# Patient Record
Sex: Female | Born: 1985 | Race: White | Hispanic: No | Marital: Single | Smoking: Never smoker
Health system: Southern US, Community
[De-identification: ages and names within clinical notes are randomized; demographics above are authoritative.]

## PROBLEM LIST (undated history)

## (undated) DIAGNOSIS — E162 Hypoglycemia, unspecified: Secondary | ICD-10-CM

## (undated) DIAGNOSIS — G43909 Migraine, unspecified, not intractable, without status migrainosus: Secondary | ICD-10-CM

## (undated) DIAGNOSIS — R001 Bradycardia, unspecified: Secondary | ICD-10-CM

## (undated) HISTORY — DX: Bradycardia, unspecified: R00.1

## (undated) HISTORY — PX: WISDOM TOOTH EXTRACTION: SHX21

---

## 2003-04-17 ENCOUNTER — Emergency Department (HOSPITAL_COMMUNITY): Admission: EM | Admit: 2003-04-17 | Discharge: 2003-04-18 | Payer: Self-pay | Admitting: Emergency Medicine

## 2013-02-06 ENCOUNTER — Encounter: Payer: Self-pay | Admitting: Family Medicine

## 2013-02-06 ENCOUNTER — Ambulatory Visit (INDEPENDENT_AMBULATORY_CARE_PROVIDER_SITE_OTHER): Payer: BC Managed Care – PPO | Admitting: Family Medicine

## 2013-02-06 VITALS — BP 120/61 | HR 62 | Temp 98.2°F | Resp 16 | Ht 65.5 in | Wt 131.0 lb

## 2013-02-06 DIAGNOSIS — G43809 Other migraine, not intractable, without status migrainosus: Secondary | ICD-10-CM

## 2013-02-06 DIAGNOSIS — Z862 Personal history of diseases of the blood and blood-forming organs and certain disorders involving the immune mechanism: Secondary | ICD-10-CM

## 2013-02-06 DIAGNOSIS — Z3041 Encounter for surveillance of contraceptive pills: Secondary | ICD-10-CM | POA: Insufficient documentation

## 2013-02-06 DIAGNOSIS — Z8639 Personal history of other endocrine, nutritional and metabolic disease: Secondary | ICD-10-CM

## 2013-02-06 DIAGNOSIS — G43009 Migraine without aura, not intractable, without status migrainosus: Secondary | ICD-10-CM

## 2013-02-06 DIAGNOSIS — H532 Diplopia: Secondary | ICD-10-CM

## 2013-02-06 LAB — CBC WITH DIFFERENTIAL/PLATELET
Basophils Relative: 0 % (ref 0–1)
Eosinophils Absolute: 0.1 10*3/uL (ref 0.0–0.7)
Hemoglobin: 13.3 g/dL (ref 12.0–15.0)
MCH: 28.1 pg (ref 26.0–34.0)
MCHC: 33.8 g/dL (ref 30.0–36.0)
Monocytes Relative: 10 % (ref 3–12)
Neutrophils Relative %: 54 % (ref 43–77)

## 2013-02-06 LAB — TSH: TSH: 1.655 u[IU]/mL (ref 0.350–4.500)

## 2013-02-06 LAB — COMPREHENSIVE METABOLIC PANEL
AST: 16 U/L (ref 0–37)
Albumin: 4.3 g/dL (ref 3.5–5.2)
Alkaline Phosphatase: 44 U/L (ref 39–117)
BUN: 9 mg/dL (ref 6–23)
Potassium: 4.2 mEq/L (ref 3.5–5.3)
Sodium: 138 mEq/L (ref 135–145)
Total Protein: 6.9 g/dL (ref 6.0–8.3)

## 2013-02-06 NOTE — Patient Instructions (Addendum)
Migraine Headache A migraine headache is an intense, throbbing pain on one or both sides of your head. A migraine can last for 30 minutes to several hours. CAUSES  The exact cause of a migraine headache is not always known. However, a migraine may be caused when nerves in the brain become irritated and release chemicals that cause inflammation. This causes pain. SYMPTOMS  Pain on one or both sides of your head.  Pulsating or throbbing pain.  Severe pain that prevents daily activities.  Pain that is aggravated by any physical activity.  Nausea, vomiting, or both.  Dizziness.  Pain with exposure to bright lights, loud noises, or activity.  General sensitivity to bright lights, loud noises, or smells. Before you get a migraine, you may get warning signs that a migraine is coming (aura). An aura may include:  Seeing flashing lights.  Seeing bright spots, halos, or zig-zag lines.  Having tunnel vision or blurred vision.  Having feelings of numbness or tingling.  Having trouble talking.  Having muscle weakness. MIGRAINE TRIGGERS  Alcohol.  Smoking.  Stress.  Menstruation.  Aged cheeses.  Foods or drinks that contain nitrates, glutamate, aspartame, or tyramine.  Lack of sleep.  Chocolate.  Caffeine.  Hunger.  Physical exertion.  Fatigue.  Medicines used to treat chest pain (nitroglycerine), birth control pills, estrogen, and some blood pressure medicines. DIAGNOSIS  A migraine headache is often diagnosed based on:  Symptoms.  Physical examination.  A CT scan or MRI of your head. TREATMENT Medicines may be given for pain and nausea. Medicines can also be given to help prevent recurrent migraines.  HOME CARE INSTRUCTIONS  Only take over-the-counter or prescription medicines for pain or discomfort as directed by your caregiver. The use of long-term narcotics is not recommended.  Lie down in a dark, quiet room when you have a migraine.  Keep a  journal to find out what may trigger your migraine headaches. For example, write down:  What you eat and drink.  How much sleep you get.  Any change to your diet or medicines.  Limit alcohol consumption.  Quit smoking if you smoke.  Get 7 to 9 hours of sleep, or as recommended by your caregiver.  Limit stress.  Keep lights dim if bright lights bother you and make your migraines worse. SEEK IMMEDIATE MEDICAL CARE IF:   Your migraine becomes severe.  You have a fever.  You have a stiff neck.  You have vision loss.  You have muscular weakness or loss of muscle control.  You start losing your balance or have trouble walking.  You feel faint or pass out.  You have severe symptoms that are different from your first symptoms. MAKE SURE YOU:   Understand these instructions.  Will watch your condition.  Will get help right away if you are not doing well or get worse. Document Released: 12/13/2005 Document Revised: 03/06/2012 Document Reviewed: 12/03/2011 [facility] Patient Information 2013 [place], [place].      Bickerstaff's Syndrome (Basilar Migraine) CAUSES  When migraine affects the circulation in back of the brain or neck, it can cause basilar migraine or Bickerstaff's syndrome.  SYMPTOMS  It occurs most frequently in young women. Symptoms include:  Dizziness.  Double vision.  Loss of balance.  Confusion.  Slurred speech.  Fainting.  Disorientation.  During the severe (acute) headache, some people lose consciousness. Often these patients are mistakenly thought to be intoxicated, under the influence of drugs, or suffering from other conditions. A previous history of  migraine is helpful in making the diagnosis.  TREATMENT  Basilar migraines are treated medically the same as all other migraines. HOME CARE INSTRUCTIONS   If this is your first diagnosed migraine headache, you may simply choose to wait and watch. You can wait to see if you have another  headache before deciding on a further treatment.  You may consult your caregiver or do as suggested by the current treating caregiver.  Numerous medications can prevent these headaches if they are recurrent or should they become recurrent. Your caregiver can help you with a medication or treatment program that will be helpful to you.  If this has been a chronic (long-standing) condition, using continuous narcotics is not recommended. Using long-term narcotics can cause recurrent migraines. Narcotics are a temporary measure only. They are used for the infrequent migraine that fails to respond to all other measures. SEEK IMMEDIATE MEDICAL CARE IF:   You do not get relief from the medications given to you or you have a recurrence of pain.  You have an unexplained oral temperature above 102 F (38.9 C), or as your caregiver suggests.  You have a stiff neck.  You have loss of vision.  You have muscular weakness.  You have loss of muscular control.  You develop severe symptoms different from your first symptoms.  You start losing your balance or have trouble walking.  You feel faint or pass out. MAKE SURE YOU:   Understand these instructions.  Will watch your condition.  Will get help right away if you are not doing well or get worse. Document Released: 12/13/2005 Document Revised: 03/06/2012 Document Reviewed: 07/31/2008 [facility] Patient Information 2013 [place], [place].

## 2013-02-06 NOTE — Progress Notes (Signed)
S:  This 27 y.o. Cauc female was last seen at [facility] in Oct 2011. She presents w/ hx of "migraine" with 1st episode in 8th grade. She does not recall if she was evaluated by medical professional. She has infrequent "episodes" w/ most recent symptom being on 02/01/2013. She had diplopia lasting about 1 minute followed by slightly off-balance and weak. This passed and she was able to go out and play tennis. She typically takes Ibuprofen 200 mg 3 tabs; this OTC med is effective. Today she feels well. She also has a hx of hypoglycemia but tries to avoid prolonged periods between meals/snacks.   Pt sees GYN for exams and RX for OCPs. She has been on oral contraception for years and notes no side effects.  Fam Hx may be positive for HA disorder; pt thinks her mother has mild HAs (not sure if she has migraines).  She works at [employer] in [employer] and does not have any significant job-related stress. She sleeps well and stays active.  ROS: As per HPI. Negative for fever, diaphoresis, unexplained weight change, CP or tightness, palpitations, SOB, n/v w/ HA, vision loss, numbness, weakness or syncope.  O:  Filed Vitals:   02/06/13 1450  BP: 120/61  Pulse: 62  Temp: 98.2 F (36.8 C)  Resp: 16   GEN: In NAD; WN,WD. HEENT: [place]/AT; EOMI w/ clear conj/ sclerae. Fundi normal. EACs/TMs normal. Oroph clear and moist.  NECK: Supple w/o LAN or TMG. COR: RRR.  No edema. LUNGS: Normal resp rate and effort. BACK: No CVAT. SKIN: W&D; no rashes or pallor. NEURO: A&O x 3; CNs intact. DTRs 2+/=. Motor function 5/5 in all major muscle groups. Gait is normal. Coordination is normal.  A/P:  1. Atypical migraine  Consider evaluation by Neurologist Continue treating as needed w/ OTC NSAID   CBC with Differential   TSH   T3, Free   Sedimentation Rate   Vitamin D, 25-hydroxy  2. Diplopia  Sedimentation Rate      Ambulatory referral to Ophthalmology  3. History of hypoglycemia  Comprehensive metabolic panel

## 2013-02-07 ENCOUNTER — Encounter: Payer: Self-pay | Admitting: Family Medicine

## 2013-02-10 ENCOUNTER — Other Ambulatory Visit: Payer: Self-pay

## 2013-03-07 ENCOUNTER — Ambulatory Visit (INDEPENDENT_AMBULATORY_CARE_PROVIDER_SITE_OTHER): Payer: BC Managed Care – PPO | Admitting: Physician Assistant

## 2013-03-07 VITALS — BP 122/58 | HR 71 | Temp 98.0°F | Resp 18 | Ht 66.0 in | Wt 129.0 lb

## 2013-03-07 DIAGNOSIS — R05 Cough: Secondary | ICD-10-CM

## 2013-03-07 DIAGNOSIS — J40 Bronchitis, not specified as acute or chronic: Secondary | ICD-10-CM

## 2013-03-07 MED ORDER — HYDROCODONE-HOMATROPINE 5-1.5 MG/5ML PO SYRP: ORAL_SOLUTION | ORAL | Status: DC

## 2013-03-07 MED ORDER — AZITHROMYCIN 250 MG PO TABS: ORAL_TABLET | ORAL | Status: DC

## 2013-03-07 MED ORDER — IPRATROPIUM BROMIDE 0.06 % NA SOLN: 2.0000 | Freq: Three times a day (TID) | NASAL | Status: DC

## 2013-03-07 NOTE — Progress Notes (Signed)
Patient ID: Otisha Spickler MRN: [id number], DOB: 1986-08-07, 27 y.o. Date of Encounter: 03/07/2013, 10:30 AM  Primary Physician: No primary provider on file.  Chief Complaint:  Chief Complaint  Patient presents with  . Sinus Problem    SINCE THURSDAY  . Cough    DRY  . Nasal Congestion    HPI: 27 y.o. year old female presents with a six day history worsening of nasal congestion, post nasal drip, sore throat, and cough. Mild maxillary sinus pressure. Afebrile. No chills. Nasal congestion thick and green/yellow. Cough is not productive and worse in the morning when she wakes. No shortness of breath or wheezing. No otalgia. Normal hearing. Has tried OTC cold preps without success. No GI complaints. Appetite normal. Multiple sick contacts at work. No recent antibiotics or recent travels. No leg trauma, sedentary periods, h/o cancer, or tobacco use.   History reviewed. No pertinent past medical history.   Home Meds: Prior to Admission medications   Medication Sig Start Date End Date Taking? Authorizing Provider  desogestrel-ethinyl estradiol (APRI,EMOQUETTE,SOLIA) 0.15-30 MG-MCG tablet Take 1 tablet by mouth daily.   Yes Historical Provider, MD           Allergies: No Known Allergies  History   Social History  . Marital Status: Single    Spouse Name: N/A    Number of Children: N/A  . Years of Education: N/A   Occupational History  . Not on file.   Social History Main Topics  . Smoking status: Never Smoker   . Smokeless tobacco: Not on file  . Alcohol Use: Not on file  . Drug Use: Not on file  . Sexually Active: Not on file   Other Topics Concern  . Not on file   Social History Narrative  . No narrative on file     Review of Systems: Constitutional: positive for fatigue. negative for chills, fever, night sweats or weight changes HEENT: see above Cardiovascular: negative for chest pain or palpitations Respiratory: positive for cough. negative for hemoptysis,  wheezing, or shortness of breath Abdominal: negative for abdominal pain, nausea, vomiting or diarrhea Dermatological: negative for rash Neurologic: negative for headache   Physical Exam: Blood pressure 122/58, pulse 71, temperature 98 F (36.7 C), temperature source Oral, resp. rate 18, height 5\' 6"  (1.676 m), weight 129 lb (58.514 kg), last menstrual period 03/06/2013, SpO2 98.00%., Body mass index is 20.83 kg/(m^2). General: Well developed, well nourished, in no acute distress. Head: Normocephalic, atraumatic, eyes without discharge, sclera non-icteric, nares are congested. Bilateral auditory canals clear, TM's are without perforation, pearly grey with reflective cone of light bilaterally. No sinus TTP. Oral cavity moist, dentition normal. Posterior pharynx with post nasal drip. No peritonsillar abscess, erythema, or tonsillar exudate. Uvula midline. Neck: Supple. No thyromegaly. Full ROM. No lymphadenopathy. Lungs: Coarse breath sounds bilaterally without wheezes, rales, or rhonchi. Breathing is unlabored.  Heart: RRR with S1 S2. No murmurs, rubs, or gallops appreciated. Msk:  Strength and tone normal for age. Extremities: No clubbing or cyanosis. No edema. Neuro: Alert and oriented X 3. Moves all extremities spontaneously. CNII-XII grossly in tact. Psych:  Responds to questions appropriately with a normal affect.     ASSESSMENT AND PLAN:  27 y.o. year old female with bronchitis and cough -Azithromycin 250 MG #6 2 po first day then 1 po next 4 days no RF -Atrovent NS 0.06% 2 sprays each nare bid prn #1 no RF -Hycodan #4oz 1 tsp po q 4-6 hours prn cough no  RF SED -Mucinex -Tylenol/Motrin prn -Rest/fluids -RTC precautions -RTC 3-5 days if no improvement  Signed, Eula Listen, PA-C 03/07/2013 10:30 AM

## 2013-03-11 ENCOUNTER — Encounter: Payer: Self-pay | Admitting: Physician Assistant

## 2013-11-01 ENCOUNTER — Other Ambulatory Visit: Payer: Self-pay

## 2016-02-27 ENCOUNTER — Ambulatory Visit (INDEPENDENT_AMBULATORY_CARE_PROVIDER_SITE_OTHER): Payer: BC Managed Care – PPO | Admitting: Physician Assistant

## 2016-02-27 VITALS — BP 112/60 | HR 50 | Temp 98.6°F | Resp 16 | Ht 66.0 in | Wt 146.0 lb

## 2016-02-27 DIAGNOSIS — B9789 Other viral agents as the cause of diseases classified elsewhere: Principal | ICD-10-CM

## 2016-02-27 DIAGNOSIS — J069 Acute upper respiratory infection, unspecified: Secondary | ICD-10-CM | POA: Diagnosis not present

## 2016-02-27 MED ORDER — HYDROCOD POLST-CPM POLST ER 10-8 MG/5ML PO SUER: 5.0000 mL | Freq: Two times a day (BID) | ORAL | Status: DC | PRN

## 2016-02-27 MED ORDER — IPRATROPIUM BROMIDE 0.06 % NA SOLN: 2.0000 | Freq: Three times a day (TID) | NASAL | Status: DC

## 2016-02-27 NOTE — Progress Notes (Signed)
   Michele Lane  MRN: [id number] DOB: [birth year]  Subjective:  Pt presents to clinic with about 3 week h/o cold symptoms.  She had a week in between that she was almost back to normal but then this week she got sick again. She currently has cough and nasal congestion.  The nasal congestion is clear but she does have a productive cough at times over the last 24h.  She is having no SOB or wheezing.  She does not have energy levels back to normal.  Home treatment - mucinex yesterday and other cold preps, motrin Sick contacts - works at [employer] vaccine - not this year  Patient Active Problem List   Diagnosis Date Noted  . 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 History of hypoglycemia 02/06/2013  . Uses oral contraception 02/06/2013    No current outpatient prescriptions on file prior to visit.   No current facility-administered medications on file prior to visit.    No Known Allergies  Review of Systems  Constitutional: Positive for fever (subjective) and chills.  HENT: Positive for congestion, postnasal drip, rhinorrhea (clear to yellow and green) and sore throat (worse in the am and eveings - ok during the day). Negative for dental problem.   Respiratory: Positive for cough and wheezing.   Gastrointestinal: Negative for nausea, vomiting and diarrhea.  Neurological: Positive for headaches. Negative for dizziness.   Objective:  BP 112/60 mmHg  Pulse 50  Temp(Src) 98.6 F (37 C) (Oral)  Resp 16  Ht  (1.676 m)  Wt 146 lb (66.225 kg)  BMI 23.58 kg/m2  SpO2 98%  Physical Exam  Constitutional: She is oriented to person, place, and time and well-developed, well-nourished, and in no distress.  HENT:  Head: Normocephalic and atraumatic.  Right Ear: Hearing, tympanic membrane, external ear and ear canal normal.  Left Ear: Hearing, tympanic membrane, external ear and ear canal normal.  Nose: Nose normal.  Mouth/Throat: Uvula is midline, oropharynx is clear and moist and mucous  membranes are normal.  congested  Eyes: Conjunctivae are normal.  Neck: Normal range of motion.  Cardiovascular: Normal rate, regular rhythm and normal heart sounds.   No murmur heard. Pulmonary/Chest: Effort normal and breath sounds normal.  Neurological: She is alert and oriented to person, place, and time. Gait normal.  Skin: Skin is warm and dry.  Psychiatric: Mood, memory, affect and judgment normal.  Vitals reviewed.   Assessment and Plan :  Viral URI with cough - Plan: chlorpheniramine-HYDROcodone (TUSSIONEX PENNKINETIC ER) 10-8 MG/5ML SUER, ipratropium (ATROVENT) 0.06 % nasal spray, Care order/instruction  Symptomatic care d/w pt  Benny Lennert PA-C  [facility] 02/27/2016 11:28 AM

## 2016-02-27 NOTE — Patient Instructions (Signed)
Please push fluids.  Tylenol and Motrin for fever and body aches.    A humidifier can help especially when the air is dry -if you do not have a humidifier you can boil a pot of water on the stove in your home to help with the dry air.  

## 2017-07-07 ENCOUNTER — Ambulatory Visit: Payer: BC Managed Care – PPO | Admitting: Emergency Medicine

## 2017-07-09 ENCOUNTER — Ambulatory Visit (INDEPENDENT_AMBULATORY_CARE_PROVIDER_SITE_OTHER): Payer: BC Managed Care – PPO | Admitting: Emergency Medicine

## 2017-07-09 ENCOUNTER — Encounter: Payer: Self-pay | Admitting: Emergency Medicine

## 2017-07-09 VITALS — BP 109/71 | HR 74 | Temp 97.6°F | Resp 18 | Ht 65.0 in | Wt 140.0 lb

## 2017-07-09 DIAGNOSIS — R200 Anesthesia of skin: Secondary | ICD-10-CM

## 2017-07-09 DIAGNOSIS — R531 Weakness: Secondary | ICD-10-CM

## 2017-07-09 DIAGNOSIS — R519 Headache, unspecified: Secondary | ICD-10-CM | POA: Insufficient documentation

## 2017-07-09 DIAGNOSIS — R51 Headache: Secondary | ICD-10-CM

## 2017-07-09 DIAGNOSIS — R42 Dizziness and giddiness: Secondary | ICD-10-CM

## 2017-07-09 MED ORDER — BUTALBITAL-APAP-CAFF-COD 50-325-40-30 MG PO CAPS: 1.0000 | ORAL_CAPSULE | ORAL | 0 refills | Status: DC | PRN

## 2017-07-09 MED ORDER — DIAZEPAM 5 MG PO TABS: 5.0000 mg | ORAL_TABLET | Freq: Two times a day (BID) | ORAL | 0 refills | Status: DC

## 2017-07-09 MED ORDER — BUTALBITAL-APAP-CAFFEINE 50-325-40 MG PO TABS: 1.0000 | ORAL_TABLET | Freq: Four times a day (QID) | ORAL | 0 refills | Status: DC | PRN

## 2017-07-09 NOTE — Progress Notes (Signed)
Michele Lane 31 y.o.   Chief Complaint  Patient presents with  . Numbness    to the left side of face   . Fatigue    weakness,headache, Nausea x 1 week     HISTORY OF PRESENT ILLNESS: [name] complaining of facial numbness, general weakness with balance issues, headache, and nausea that started 8 days ago and led to 2 ED visits during which she had CT brain and chest along with blood work done; results were all WNL, prescribed Valium and Fioricet; feels 25-50 % better than day 1-2; physically active, in good shape; has h/o chronic recurrent headaches she describes as migraines but never formally diagnosed. At present time she denies any neurological deficits but still having balance issues. Found to have PVC's while in the ED; EKG was normal.  HPI   Prior to Admission medications   Medication Sig Start Date End Date Taking? Authorizing Provider  butalbital-acetaminophen-caffeine (FIORICET WITH CODEINE) 50-325-40-30 MG capsule Take 1 capsule by mouth every 4 (four) hours as needed for headache.   Yes [provider]  diazepam (VALIUM) 5 MG tablet Take 5 mg by mouth every 6 (six) hours as needed for anxiety.   Yes [provider]  levonorgestrel-ethinyl estradiol (SEASONALE,INTROVALE,JOLESSA) 0.15-0.03 MG tablet Take 1 tablet by mouth daily. 11/28/15  Yes [provider]    No Known Allergies  Patient Active Problem List   Diagnosis Date Noted  . 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 History of hypoglycemia 02/06/2013  . Uses oral contraception 02/06/2013    History reviewed. No pertinent past medical history.  History reviewed. No pertinent surgical history.  Social History   Social History  . Marital status: Single    Spouse name: N/A  . Number of children: N/A  . Years of education: N/A   Occupational History  . Not on file.   Social History Main Topics  . Smoking status: Never Smoker  . Smokeless tobacco: Never Used  .  Alcohol use Yes  . Drug use: No  . Sexual activity: Yes    Birth control/ protection: Pill   Other Topics Concern  . Not on file   Social History Narrative   [employer] - class scheduler    Family History  Problem Relation Age of Onset  . Diabetes Maternal Grandfather      Review of Systems  Constitutional: Positive for malaise/fatigue. Negative for chills and fever.  HENT: Negative.  Negative for congestion, ear pain, nosebleeds, sinus pain and sore throat.   Eyes: Positive for photophobia. Negative for blurred vision, double vision, discharge and redness.  Respiratory: Negative.  Negative for cough, hemoptysis and shortness of breath.   Cardiovascular: Negative.  Negative for chest pain, palpitations and leg swelling.  Gastrointestinal: Negative.  Negative for abdominal pain, diarrhea, nausea and vomiting.  Genitourinary: Negative.   Musculoskeletal: Negative.  Negative for back pain, myalgias and neck pain.  Skin: Negative.  Negative for rash.  Neurological: Positive for dizziness, tingling (facial and lips), sensory change (face and tongue), weakness and headaches. Negative for speech change, focal weakness, seizures and loss of consciousness.  Endo/Heme/Allergies: Negative.   All other systems reviewed and are negative.    Vitals:   07/09/17 1110  BP: 109/71  Pulse: 74  Resp: 18  Temp: 97.6 F (36.4 C)     Physical Exam  Constitutional: She is oriented to person, place, and time. She appears well-developed and well-nourished.  HENT:  Head: Normocephalic and atraumatic.  Right Ear: Tympanic membrane, external ear and ear canal normal.  Left Ear: Tympanic membrane, external ear and ear canal normal.  Nose: Nose normal.  Mouth/Throat: Oropharynx is clear and moist. No oropharyngeal exudate.  Eyes: Pupils are equal, round, and reactive to light. Conjunctivae and EOM are normal.  Neck: Normal range of motion. Neck supple. No JVD present. No thyromegaly present.    Cardiovascular: Normal rate, regular rhythm, normal heart sounds and intact distal pulses.   Pulmonary/Chest: Effort normal and breath sounds normal.  Abdominal: Soft. Bowel sounds are normal. There is no tenderness.  Musculoskeletal: Normal range of motion.  Lymphadenopathy:    She has no cervical adenopathy.  Neurological: She is alert and oriented to person, place, and time. She displays normal reflexes. No cranial nerve deficit or sensory deficit. She exhibits normal muscle tone. Coordination normal.  Straight line walking a little off balance. Struggled.  Skin: Skin is warm and dry. Capillary refill takes less than 2 seconds.  Psychiatric: She has a normal mood and affect. Her behavior is normal.  Vitals reviewed.    ASSESSMENT & PLAN: Michele Lane was seen today for numbness and fatigue.  Diagnoses and all orders for this visit:  Facial numbness -     CBC with Differential/Platelet -     Comprehensive metabolic panel -     Sedimentation Rate -     Lyme Ab/Western Blot Reflex -     ANA,IFA RA Diag Pnl w/rflx Tit/Patn -     MR Brain W Wo Contrast; Future -     Ambulatory referral to Neurology  Intractable headache, unspecified chronicity pattern, unspecified headache type Comments: suspected migraine Orders: -     CBC with Differential/Platelet -     Comprehensive metabolic panel -     Sedimentation Rate -     Lyme Ab/Western Blot Reflex -     ANA,IFA RA Diag Pnl w/rflx Tit/Patn -     MR Brain W Wo Contrast; Future -     Ambulatory referral to Neurology  Dizziness  General weakness  Other orders -     diazepam (VALIUM) 5 MG tablet; Take 1 tablet (5 mg total) by mouth 2 (two) times daily. As needed. -     Discontinue: butalbital-acetaminophen-caffeine (FIORICET WITH CODEINE) 50-325-40-30 MG capsule; Take 1 capsule by mouth every 4 (four) hours as needed for headache. -     butalbital-acetaminophen-caffeine (ESGIC) 50-325-40 MG tablet; Take 1 tablet by mouth every 6  (six) hours as needed for headache.     Patient Instructions       IF you received an x-ray today, you will receive an invoice from [facility]. Please contact [facility] at [phone] with questions or concerns regarding your invoice.   IF you received labwork today, you will receive an invoice from [facility]. Please contact [facility] at [phone] with questions or concerns regarding your invoice.   Our billing staff will not be able to assist you with questions regarding bills from these companies.  You will be contacted with the lab results as soon as they are available. The fastest way to get your results is to activate your My Chart account. Instructions are located on the last page of this paperwork. If you have not heard from [place] regarding the results in 2 weeks, please contact this office.      Migraine Headache A migraine headache is a very strong throbbing pain on one side or both sides of your head. Migraines can also cause other  symptoms. Talk with your doctor about what things may bring on (trigger) your migraine headaches. Follow these instructions at home: Medicines  Take over-the-counter and prescription medicines only as told by your doctor.  Do not drive or use heavy machinery while taking prescription pain medicine.  To prevent or treat constipation while you are taking prescription pain medicine, your doctor may recommend that you: ? Drink enough fluid to keep your pee (urine) clear or pale yellow. ? Take over-the-counter or prescription medicines. ? Eat foods that are high in fiber. These include fresh fruits and vegetables, whole grains, and beans. ? Limit foods that are high in fat and processed sugars. These include fried and sweet foods. Lifestyle  Avoid alcohol.  Do not use any products that contain nicotine or tobacco, such as cigarettes and e-cigarettes. If you need help quitting, ask your doctor.  Get at least 8 hours of  sleep every night.  Limit your stress. General instructions   Keep a journal to find out what may bring on your migraines. For example, write down: ? What you eat and drink. ? How much sleep you get. ? Any change in what you eat or drink. ? Any change in your medicines.  If you have a migraine: ? Avoid things that make your symptoms worse, such as bright lights. ? It may help to lie down in a dark, quiet room. ? Do not drive or use heavy machinery. ? Ask your doctor what activities are safe for you.  Keep all follow-up visits as told by your doctor. This is important. Contact a doctor if:  You get a migraine that is different or worse than your usual migraines. Get help right away if:  Your migraine gets very bad.  You have a fever.  You have a stiff neck.  You have trouble seeing.  Your muscles feel weak or like you cannot control them.  You start to lose your balance a lot.  You start to have trouble walking.  You pass out (faint). This information is not intended to replace advice given to you by your health care provider. Make sure you discuss any questions you have with your health care provider. Document Released: 09/21/2008 Document Revised: 07/02/2016 Document Reviewed: 05/31/2016 Elsevier Interactive Patient Education  2017 [employer]      Edwina Barth, MD [facility]

## 2017-07-09 NOTE — Patient Instructions (Addendum)
     IF you received an x-ray today, you will receive an invoice from [facility]. Please contact [facility] at [phone] with questions or concerns regarding your invoice.   IF you received labwork today, you will receive an invoice from [facility]. Please contact [facility] at [phone] with questions or concerns regarding your invoice.   Our billing staff will not be able to assist you with questions regarding bills from these companies.  You will be contacted with the lab results as soon as they are available. The fastest way to get your results is to activate your My Chart account. Instructions are located on the last page of this paperwork. If you have not heard from us regarding the results in 2 weeks, please contact this office.     Migraine Headache A migraine headache is a very strong throbbing pain on one side or both sides of your head. Migraines can also cause other symptoms. Talk with your doctor about what things may bring on (trigger) your migraine headaches. Follow these instructions at home: Medicines  Take over-the-counter and prescription medicines only as told by your doctor.  Do not drive or use heavy machinery while taking prescription pain medicine.  To prevent or treat constipation while you are taking prescription pain medicine, your doctor may recommend that you: ? Drink enough fluid to keep your pee (urine) clear or pale yellow. ? Take over-the-counter or prescription medicines. ? Eat foods that are high in fiber. These include fresh fruits and vegetables, whole grains, and beans. ? Limit foods that are high in fat and processed sugars. These include fried and sweet foods. Lifestyle  Avoid alcohol.  Do not use any products that contain nicotine or tobacco, such as cigarettes and e-cigarettes. If you need help quitting, ask your doctor.  Get at least 8 hours of sleep every night.  Limit your stress. General instructions   Keep a  journal to find out what may bring on your migraines. For example, write down: ? What you eat and drink. ? How much sleep you get. ? Any change in what you eat or drink. ? Any change in your medicines.  If you have a migraine: ? Avoid things that make your symptoms worse, such as bright lights. ? It may help to lie down in a dark, quiet room. ? Do not drive or use heavy machinery. ? Ask your doctor what activities are safe for you.  Keep all follow-up visits as told by your doctor. This is important. Contact a doctor if:  You get a migraine that is different or worse than your usual migraines. Get help right away if:  Your migraine gets very bad.  You have a fever.  You have a stiff neck.  You have trouble seeing.  Your muscles feel weak or like you cannot control them.  You start to lose your balance a lot.  You start to have trouble walking.  You pass out (faint). This information is not intended to replace advice given to you by your health care provider. Make sure you discuss any questions you have with your health care provider. Document Released: 09/21/2008 Document Revised: 07/02/2016 Document Reviewed: 05/31/2016 Elsevier Interactive Patient Education  2017 Elsevier Inc.  

## 2017-07-14 ENCOUNTER — Encounter: Payer: Self-pay | Admitting: Neurology

## 2017-07-14 ENCOUNTER — Telehealth: Payer: Self-pay | Admitting: Emergency Medicine

## 2017-07-14 NOTE — Telephone Encounter (Signed)
Please advise 

## 2017-07-14 NOTE — Telephone Encounter (Signed)
Michele Lane is a good idea and she can pick one up at medical store or [facility], [facility], etc.

## 2017-07-14 NOTE — Telephone Encounter (Signed)
Pt's mother has been in contact with me concerning pt referral. We have not been able to get pt scheduled very soon even with urgent referral. I was able to get pt scheduled for [facility] on 07/28/17 at 1:45pm. I have also sent the referral to [facility] to see if they can schedule any sooner or around the same time for pt travel convenience. For now, they are keeping the [facility] appt and will see if [facility] offers anything sooner. The pt also wanted to ask if she could have a cane to help her walking. She didn't know if this was something we could order or if she should just pick one up from a medical store. Please let pt know what to do about the cane. Pt's mother can be reached at [phone].

## 2017-07-15 ENCOUNTER — Other Ambulatory Visit: Payer: Self-pay | Admitting: Emergency Medicine

## 2017-07-15 ENCOUNTER — Ambulatory Visit (HOSPITAL_COMMUNITY)
Admission: RE | Admit: 2017-07-15 | Discharge: 2017-07-15 | Disposition: A | Discharge status: Home / Self Care | Payer: BC Managed Care – PPO | Source: Ambulatory Visit | Attending: Emergency Medicine | Admitting: Emergency Medicine

## 2017-07-15 ENCOUNTER — Telehealth: Payer: Self-pay | Admitting: Family Medicine

## 2017-07-15 ENCOUNTER — Encounter (HOSPITAL_COMMUNITY): Payer: Self-pay | Admitting: Emergency Medicine

## 2017-07-15 ENCOUNTER — Inpatient Hospital Stay (HOSPITAL_COMMUNITY)
Admission: EM | Admit: 2017-07-15 | Discharge: 2017-07-19 | DRG: 060 | Disposition: A | Discharge status: Home / Self Care | Payer: BC Managed Care – PPO | Attending: Internal Medicine | Admitting: Internal Medicine

## 2017-07-15 ENCOUNTER — Ambulatory Visit (INDEPENDENT_AMBULATORY_CARE_PROVIDER_SITE_OTHER): Payer: BC Managed Care – PPO | Admitting: Emergency Medicine

## 2017-07-15 ENCOUNTER — Encounter: Payer: Self-pay | Admitting: Emergency Medicine

## 2017-07-15 VITALS — BP 107/67 | HR 41 | Temp 97.4°F | Resp 16 | Ht 65.5 in | Wt 138.2 lb

## 2017-07-15 DIAGNOSIS — G43001 Migraine without aura, not intractable, with status migrainosus: Secondary | ICD-10-CM | POA: Diagnosis present

## 2017-07-15 DIAGNOSIS — R51 Headache: Secondary | ICD-10-CM

## 2017-07-15 DIAGNOSIS — R42 Dizziness and giddiness: Secondary | ICD-10-CM | POA: Diagnosis present

## 2017-07-15 DIAGNOSIS — I4581 Long QT syndrome: Secondary | ICD-10-CM | POA: Diagnosis present

## 2017-07-15 DIAGNOSIS — R001 Bradycardia, unspecified: Secondary | ICD-10-CM

## 2017-07-15 DIAGNOSIS — R200 Anesthesia of skin: Secondary | ICD-10-CM | POA: Diagnosis present

## 2017-07-15 DIAGNOSIS — R112 Nausea with vomiting, unspecified: Secondary | ICD-10-CM | POA: Diagnosis not present

## 2017-07-15 DIAGNOSIS — G43009 Migraine without aura, not intractable, without status migrainosus: Secondary | ICD-10-CM | POA: Diagnosis present

## 2017-07-15 DIAGNOSIS — R519 Headache, unspecified: Secondary | ICD-10-CM

## 2017-07-15 DIAGNOSIS — G35 Multiple sclerosis: Secondary | ICD-10-CM | POA: Diagnosis not present

## 2017-07-15 DIAGNOSIS — G35D Multiple sclerosis, unspecified: Secondary | ICD-10-CM | POA: Diagnosis present

## 2017-07-15 DIAGNOSIS — Z833 Family history of diabetes mellitus: Secondary | ICD-10-CM

## 2017-07-15 DIAGNOSIS — Z8051 Family history of malignant neoplasm of kidney: Secondary | ICD-10-CM

## 2017-07-15 HISTORY — DX: Migraine, unspecified, not intractable, without status migrainosus: G43.909

## 2017-07-15 HISTORY — DX: Hypoglycemia, unspecified: E16.2

## 2017-07-15 LAB — COMPREHENSIVE METABOLIC PANEL
ALBUMIN: 4.4 g/dL (ref 3.5–5.5)
ALBUMIN: 4.2 g/dL (ref 3.5–5.0)
ALK PHOS: 53 U/L (ref 38–126)
ALT: 11 IU/L (ref 0–32)
ALT: 25 U/L (ref 14–54)
ANION GAP: 7 (ref 5–15)
AST: 25 IU/L (ref 0–40)
AST: 25 U/L (ref 15–41)
Albumin/Globulin Ratio: 1.7 (ref 1.2–2.2)
Alkaline Phosphatase: 54 IU/L (ref 39–117)
BILIRUBIN TOTAL: 0.4 mg/dL (ref 0.3–1.2)
BUN / CREAT RATIO: 13 (ref 9–23)
BUN: 10 mg/dL (ref 6–20)
BUN: 9 mg/dL (ref 6–20)
Bilirubin Total: 0.3 mg/dL (ref 0.0–1.2)
CALCIUM: 9.7 mg/dL (ref 8.9–10.3)
CO2: 16 mmol/L — AB (ref 20–29)
CO2: 26 mmol/L (ref 22–32)
CREATININE: 0.77 mg/dL (ref 0.57–1.00)
CREATININE: 0.84 mg/dL (ref 0.44–1.00)
Calcium: 9.7 mg/dL (ref 8.7–10.2)
Chloride: 109 mmol/L — ABNORMAL HIGH (ref 96–106)
Chloride: 104 mmol/L (ref 101–111)
GFR calc Af Amer: 117 mL/min/{1.73_m2} (ref >59)
GFR calc Af Amer: >60 mL/min (ref >60)
GFR calc non Af Amer: 102 mL/min/{1.73_m2} (ref >59)
GFR calc non Af Amer: >60 mL/min (ref >60)
GLUCOSE: 106 mg/dL — AB (ref 65–99)
GLUCOSE: 99 mg/dL (ref 65–99)
Globulin, Total: 2.6 g/dL (ref 1.5–4.5)
Potassium: 4.3 mmol/L (ref 3.5–5.2)
Potassium: 4.2 mmol/L (ref 3.5–5.1)
Sodium: 147 mmol/L — ABNORMAL HIGH (ref 134–144)
Sodium: 137 mmol/L (ref 135–145)
TOTAL PROTEIN: 7 g/dL (ref 6.0–8.5)
TOTAL PROTEIN: 7.2 g/dL (ref 6.5–8.1)

## 2017-07-15 LAB — CBC WITH DIFFERENTIAL/PLATELET
BASOS ABS: 0 10*3/uL (ref 0.0–0.2)
BASOS PCT: 0 %
Basophils Absolute: 0 10*3/uL (ref 0.0–0.1)
Basos: 0 %
EOS (ABSOLUTE): 0 10*3/uL (ref 0.0–0.4)
Eos: 1 %
Eosinophils Absolute: 0.1 10*3/uL (ref 0.0–0.7)
Eosinophils Relative: 1 %
HEMATOCRIT: 39.9 % (ref 36.0–46.0)
HEMOGLOBIN: 12.8 g/dL (ref 11.1–15.9)
HEMOGLOBIN: 13.1 g/dL (ref 12.0–15.0)
Hematocrit: 40.2 % (ref 34.0–46.6)
IMMATURE GRANS (ABS): 0 10*3/uL (ref 0.0–0.1)
Immature Granulocytes: 0 %
LYMPHS ABS: 3 10*3/uL (ref 0.7–4.0)
LYMPHS: 23 %
Lymphocytes Absolute: 1.5 10*3/uL (ref 0.7–3.1)
Lymphocytes Relative: 35 %
MCH: 25.9 pg — ABNORMAL LOW (ref 26.6–33.0)
MCH: 27.5 pg (ref 26.0–34.0)
MCHC: 31.8 g/dL (ref 31.5–35.7)
MCHC: 32.8 g/dL (ref 30.0–36.0)
MCV: 81 fL (ref 79–97)
MCV: 83.8 fL (ref 78.0–100.0)
MONOCYTES: 7 %
MONOS PCT: 7 %
Monocytes Absolute: 0.5 10*3/uL (ref 0.1–0.9)
Monocytes Absolute: 0.6 10*3/uL (ref 0.1–1.0)
NEUTROS ABS: 4.8 10*3/uL (ref 1.7–7.7)
NEUTROS PCT: 57 %
Neutrophils Absolute: 4.5 10*3/uL (ref 1.4–7.0)
Neutrophils: 69 %
PLATELETS: 271 10*3/uL (ref 150–379)
Platelets: 296 10*3/uL (ref 150–400)
RBC: 4.94 x10E6/uL (ref 3.77–5.28)
RBC: 4.76 MIL/uL (ref 3.87–5.11)
RDW: 14.3 % (ref 12.3–15.4)
RDW: 13.3 % (ref 11.5–15.5)
WBC: 6.5 10*3/uL (ref 3.4–10.8)
WBC: 8.5 10*3/uL (ref 4.0–10.5)

## 2017-07-15 LAB — ANA,IFA RA DIAG PNL W/RFLX TIT/PATN
ANA TITER 1: NEGATIVE
Cyclic Citrullin Peptide Ab: 1 units (ref 0–19)
Rhuematoid fact SerPl-aCnc: <10 IU/mL (ref 0.0–13.9)

## 2017-07-15 LAB — URINALYSIS, ROUTINE W REFLEX MICROSCOPIC
BACTERIA UA: NONE SEEN
Bilirubin Urine: NEGATIVE
GLUCOSE, UA: NEGATIVE mg/dL
Ketones, ur: NEGATIVE mg/dL
LEUKOCYTES UA: NEGATIVE
Nitrite: NEGATIVE
PH: 6 (ref 5.0–8.0)
Protein, ur: NEGATIVE mg/dL
SPECIFIC GRAVITY, URINE: 1.002 — AB (ref 1.005–1.030)
SQUAMOUS EPITHELIAL / LPF: NONE SEEN
WBC, UA: NONE SEEN WBC/hpf (ref 0–5)

## 2017-07-15 LAB — I-STAT BETA HCG BLOOD, ED (MC, WL, AP ONLY): I-stat hCG, quantitative: <5 m[IU]/mL (ref <5)

## 2017-07-15 LAB — SEDIMENTATION RATE: SED RATE: 6 mm/h (ref 0–32)

## 2017-07-15 LAB — LYME AB/WESTERN BLOT REFLEX: Lyme IgG/IgM Ab: <0.91 {ISR} (ref 0.00–0.90)

## 2017-07-15 MED ORDER — ONDANSETRON HCL 4 MG PO TABS: 4.0000 mg | ORAL_TABLET | Freq: Three times a day (TID) | ORAL | 0 refills | Status: DC | PRN

## 2017-07-15 MED ORDER — DIAZEPAM 5 MG PO TABS: 5.0000 mg | ORAL_TABLET | Freq: Two times a day (BID) | ORAL | 0 refills | Status: DC

## 2017-07-15 MED ORDER — GADOBENATE DIMEGLUMINE 529 MG/ML IV SOLN
15.0000 mL | Freq: Once | INTRAVENOUS | Status: AC | PRN
  Administered 2017-07-15: 15 mL via INTRAVENOUS

## 2017-07-15 MED ORDER — ONDANSETRON HCL 4 MG/2ML IJ SOLN
2.0000 mg | Freq: Once | INTRAMUSCULAR | Status: AC
  Administered 2017-07-15: 2 mg via INTRAMUSCULAR

## 2017-07-15 MED ORDER — SODIUM CHLORIDE 0.9 % IV SOLN
1000.0000 mg | Freq: Once | INTRAVENOUS | Status: AC
  Administered 2017-07-16: 1000 mg via INTRAVENOUS
  Filled 2017-07-15: qty 8

## 2017-07-15 MED ORDER — BUTALBITAL-APAP-CAFFEINE 50-325-40 MG PO TABS: 1.0000 | ORAL_TABLET | Freq: Four times a day (QID) | ORAL | 0 refills | Status: DC | PRN

## 2017-07-15 NOTE — Telephone Encounter (Signed)
Received call report from Dr. Margo Aye of radiology regarding MRI brain revealed multiple enhancing lesions consistent with active demyelination.  Spoke with patient's mother (per patient's request) regarding results.  Spoke with neurology on call at [facility] who recommended admission for iv solumedrol, lumbar puncture, and further confirmation of multiple sclerosis.  Advised patient and mother of recommendations for admission; pt desires admission/presentation at [facility]; mother to transport pt there now.  To Dr. Verita Lamb.

## 2017-07-15 NOTE — Consult Note (Signed)
NEURO HOSPITALIST CONSULT NOTE   Requestig physician: Dr. Bebe Shaggy  Reason for Consult: MRI findings most consistent with MS  History obtained from:  Patient and Chart     HPI:                                                                                                                                          Michele Lane is an 31 y.o. female who presents with a 2 week history of progressive left facial sensory numbness, headaches, dizziness and gait unsteadiness. Symptoms began on June 6. An outpatient MRI of the brain was obtained on Friday, revealing multiple enhancing and non-enhancing lesions exhibiting a distribution, morphologies and signal characteristics most consistent with multiple sclerosis (images personally reviewed).   The official Radiology report conclusions for the MRI are as follows: 1. Fairly numerous T2 and FLAIR lesions in the cerebral white matter, and nearly 2 cm lesion of the left cerebellar peduncle. The constellation of findings is strongly suggestive of Multiple Sclerosis. 2. At least 8 lesions, including that in the left cerebellar peduncle, are enhancing indicative of active demyelination.  She has no prior history of neurological symptoms, including no episodes of optic neuritis. 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   Past Medical History:  Diagnosis Date  . Hypoglycemia    History reviewed. No pertinent surgical history.  Family History  Problem Relation Age of Onset  . Diabetes Maternal Grandfather    Social History:  reports that she has never smoked. She has never used smokeless tobacco. She reports that she drinks alcohol. She reports that she does not use drugs.  No Known Allergies  MEDICATIONS:                                                                                                                     Prior to Admission:  Prescriptions Prior to  Admission  Medication Sig Dispense Refill Last Dose  . butalbital-acetaminophen-caffeine (ESGIC) 50-325-40 MG tablet Take 1 tablet by mouth every 6 (six) hours as needed for headache. 30 tablet 0 07/15/2017 at Unknown time  . diazepam (VALIUM) 5 MG tablet Take 1 tablet (5 mg total) by mouth 2 (two) times daily. As needed. (  Patient taking differently: Take 5 mg by mouth every 12 (twelve) hours as needed for anxiety or muscle spasms. As needed.) 20 tablet 0 07/15/2017 at Unknown time  . ibuprofen (ADVIL,MOTRIN) 200 MG tablet Take 200-400 mg by mouth every 6 (six) hours as needed for moderate pain.   07/15/2017 at Unknown time  . levonorgestrel-ethinyl estradiol (SEASONALE,INTROVALE,JOLESSA) 0.15-0.03 MG tablet Take 1 tablet by mouth daily.  0 07/13/2017  . ondansetron (ZOFRAN) 4 MG tablet Take 1 tablet (4 mg total) by mouth every 8 (eight) hours as needed for nausea or vomiting. 20 tablet 0 unk    ROS:                                                                                                                                       No chest pain, abdominal pain, limb pain, limb weakness, limb sensory numbness, vision changes or confusion. No symptoms of infection. Other ROS as per HPI.   Blood pressure 119/76, pulse 82, temperature 98.5 F (36.9 C), temperature source Oral, resp. rate 17, height 5\' 5"  (1.651 m), weight 62.6 kg (138 lb), last menstrual period 07/14/2017, SpO2 100 %.  General Examination:                                                                                                      HEENT-  [place]/AT   Lungs- Respirations unlabored Extremities- No edema  Neurological Examination Mental Status: Alert, fully oriented, thought content appropriate.  Speech fluent without evidence of aphasia.  Able to follow all commands without difficulty. Cranial Nerves: II: Visual fields intact bilaterally. PERRL  III,IV, VI: Ptosis not present. EOMI without nystagmus.   V,VII: Smile symmetric.  Facial temp sensation normal bilaterally VIII: Hearing intact to voice IX,X: No hypophonia or hoarseness XI: Symmetric XII: Midline tongue extension Motor: Right : Upper extremity   5/5    Left:     Upper extremity   5/5  Lower extremity   5/5     Lower extremity   5/5 Normal tone throughout; no atrophy noted Sensory: Temp and light touch intact in all 4 extremities without extinction Deep Tendon Reflexes: 2+ and symmetric throughout Plantars: Right: downgoing  Left: downgoing Cerebellar: No ataxia with FNF bilaterally Gait: Deferred  Lab Results: Basic Metabolic Panel:  Recent Labs Lab 07/09/17 1150 07/15/17 2028  NA 147* 137  K 4.3 4.2  CL 109* 104  CO2 16* 26  GLUCOSE 106* 99  BUN 10 9  CREATININE 0.77  0.84  CALCIUM 9.7 9.7    Liver Function Tests:  Recent Labs Lab 07/09/17 1150 07/15/17 2028  AST 25 25  ALT 11 25  ALKPHOS 54 53  BILITOT 0.3 0.4  PROT 7.0 7.2  ALBUMIN 4.4 4.2   No results for input(s): LIPASE, AMYLASE in the last 168 hours. No results for input(s): AMMONIA in the last 168 hours.  CBC:  Recent Labs Lab 07/09/17 1150 07/15/17 2028  WBC 6.5 8.5  NEUTROABS 4.5 4.8  HGB 12.8 13.1  HCT 40.2 39.9  MCV 81 83.8  PLT 271 296    Cardiac Enzymes: No results for input(s): CKTOTAL, CKMB, CKMBINDEX, TROPONINI in the last 168 hours.  Lipid Panel: No results for input(s): CHOL, TRIG, HDL, CHOLHDL, VLDL, LDLCALC in the last 168 hours.  CBG: No results for input(s): GLUCAP in the last 168 hours.  Microbiology: No results found for this or any previous visit.  Coagulation Studies: No results for input(s): LABPROT, INR in the last 72 hours.  Imaging: Mr [name] CM Contrast  Result Date: 07/15/2017 CLINICAL DATA:  31 year old female with intractable headache, left facial numbness. Possible migraine. EXAM: MRI HEAD WITHOUT AND WITH CONTRAST TECHNIQUE: Multiplanar, multiecho pulse sequences of the brain and surrounding structures were  obtained without and with intravenous contrast. CONTRAST:  71mL MULTIHANCE GADOBENATE DIMEGLUMINE 529 MG/ML IV SOLN COMPARISON:  None. FINDINGS: Brain: There are fairly numerous foci of abnormal cerebral white matter T2 and FLAIR hyperintensity. Multiple periventricular lesions are demonstrated, and several of these have a perpendicular orientation to the ventricles (Dawson fingers series 10, image 99). There is marginal involvement of the body of the left corpus callosum. There are scattered subcortical white matter lesions, including in both temporal lobes. The largest lesion is a nearly 2 cm T2 and FLAIR hyperintense area in the left cerebellar peduncle (series 6, image 10). Most of the lesions demonstrate increased trace diffusion signal, but only a right occipital periventricular white matter lesion appears restricted on diffusion (series 350, image 30). However, following contrast administration multiple of the lesions are seen to enhance. The left cerebellar peduncle lesion demonstrates an enhancing area of 13 mm. At least 7 other subcentimeter enhancing white matter lesions are scattered in both hemispheres (series 12 and series 13. Despite the size and enhancement of the above lesions, there is no associated mass effect, and no surrounding cerebral edema. No definite cortical involvement is identified. There is no involvement of the deep gray matter nuclei. The brainstem and cerebellum appear spared except for the left cerebellar peduncle. The cervicomedullary junction and visible cervical spine appear grossly normal. No vascular territory areas of restricted diffusion to suggest acute infarction. No midline shift, ventriculomegaly, extra-axial collection or acute intracranial hemorrhage. Cervicomedullary junction and pituitary are within normal limits. No chronic cerebral blood products or mineralization. Vascular: Major intracranial vascular flow voids are preserved. Skull and upper cervical spine:  Grossly negative visualized cervical spine. Visualized bone marrow signal is within normal limits. Sinuses/Orbits: No optic nerve or orbit signal abnormality is evident. Paranasal sinuses are clear. Other: Mastoid air cells are clear. Visible internal auditory structures appear normal. Negative scalp and face soft tissues. IMPRESSION: 1. Fairly numerous T2 and FLAIR lesions in the cerebral white matter, and nearly 2 cm lesion of the left cerebellar peduncle. The constellation of findings is is strongly suggestive of Multiple Sclerosis. 2. At least 8 lesions, including that in the left cerebellar peduncle, are enhancing indicative of active demyelination. 3. Preliminary report of this study  was discussed by telephone with Dr. Nilda Simmer, who is covering for Dr. Edwina Barth , at 1555 hours today. Electronically Signed   By: Odessa Fleming M.D.   On: 07/15/2017 16:24   Assessment: 30 year old female with multiple lesions on MRI appearing most consistent with MS. Several of the lesions enhance, consistent with acute exacerbation. No prior diagnosis of MS.  1. Other DDx includes neurosarcoidosis, CNS lupus, or atypical appearance for ADEM (there is no involvement of the deep grey nuclei). Epidemiologically and based upon imaging characteristics, MS is significantly more likely. Infection unlikely given no fever or constitutional symptoms, no recent tick bite or travel to endemic regions and no exposure to sick contacts.  2. Recently had Lyme titers drawn as an outpatient, which can be followed up by her PCP.   Recommendations: 1. MRI cervical spine with and without contrast to rule out cervical spinal cord lesions.  2. LP as an outpatient. Should follow up in 1-2 weeks with either [facility] or [facility].  3. Discussed benefits/risks of long-term disease modifying therapy with the patient and her mother. Final decision regarding such should be made at her outpatient Neurology clinic visit.  4.  Methylprednisolone 1000 mg IV qd x 5 days. Monitor CBC and Chem7 daily.  5. Start patient on a vitamin D supplement.   Electronically signed: Dr. Caryl Pina 07/15/2017, 11:48 PM

## 2017-07-15 NOTE — ED Triage Notes (Addendum)
Pt. reports left facial numbness onset 2 weeks ago with mild headache , nausea and emesis x2 today , pt. had an MRI of brain this afternoon due to these symptoms - advised by her PCP to go to ER for further evaluation .

## 2017-07-15 NOTE — ED Provider Notes (Signed)
[facility]-EMERGENCY DEPT Provider Note   CSN: [id number] Arrival date & time: 07/15/17  2014     History   Chief Complaint Chief Complaint  Patient presents with  . Numbness    HPI Michele Lane is a 31 y.o. female.  Patient presents to the ED with a chief complaint of facial numbness. She states that she was diagnosed with MS today and was told to come to the ER for an acute exacerbation.  She states that she has had worsening numbness of her left face and scalp with some minor blurred vision which has been gradually worsening over the course of the week.  She states that she originally thought that her symptoms were from a migraine, but after a couple of ED visits at out of town facilities she followed up with her PCP, who ordered an MRI, which she had done today.  The MRI showed evidence concerning for MS, and she was referred to the ER for treatment of what was thought to be an acute exacerbation.  She denies family history of MS.  She states that a couple of years ago she had diploplia, but this resolved and wasn't pursued further at that point.   The history is provided by the patient. No language interpreter was used.    Past Medical History:  Diagnosis Date  . Hypoglycemia     Patient Active Problem List   Diagnosis Date Noted  . Nausea and vomiting 07/15/2017  . Facial numbness 07/09/2017  . Intractable headache 07/09/2017  . Dizziness 07/09/2017  . General weakness 07/09/2017  . 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 History of hypoglycemia 02/06/2013  . Uses oral contraception 02/06/2013    History reviewed. No pertinent surgical history.  OB History    No data available       Home Medications    Prior to Admission medications   Medication Sig Start Date End Date Taking? Authorizing Provider  butalbital-acetaminophen-caffeine [facility]) 50-325-40 MG tablet Take 1 tablet by mouth every 6 (six) hours as needed for headache. 07/15/17   Georgina Quint, MD  diazepam  (VALIUM) 5 MG tablet Take 1 tablet (5 mg total) by mouth 2 (two) times daily. As needed. 07/15/17   Georgina Quint, MD  levonorgestrel-ethinyl estradiol (SEASONALE,INTROVALE,JOLESSA) 0.15-0.03 MG tablet Take 1 tablet by mouth daily. 11/28/15   [provider]  ondansetron (ZOFRAN) 4 MG tablet Take 1 tablet (4 mg total) by mouth every 8 (eight) hours as needed for nausea or vomiting. 07/15/17   Georgina Quint, MD    Family History Family History  Problem Relation Age of Onset  . Diabetes Maternal Grandfather     Social History Social History  Substance Use Topics  . Smoking status: Never Smoker  . Smokeless tobacco: Never Used  . Alcohol use Yes     Allergies   Patient has no known allergies.   Review of Systems Review of Systems  All other systems reviewed and are negative.    Physical Exam Updated Vital Signs BP 119/76   Pulse 82   Temp 98.5 F (36.9 C) (Oral)   Resp 17   Ht 5\' 5"  (1.651 m)   Wt 62.6 kg (138 lb)   LMP 07/14/2017   SpO2 100%   BMI 22.96 kg/m   Physical Exam  Constitutional: She is oriented to person, place, and time. She appears well-developed and well-nourished.  HENT:  Head: Normocephalic and atraumatic.  Right Ear: External ear normal.  Left Ear: External ear normal.  Eyes: Pupils are equal, round, and reactive to light. Conjunctivae and EOM are normal.  Neck: Normal range of motion. Neck supple.  No pain with neck flexion, no meningismus  Cardiovascular: Normal rate, regular rhythm and normal heart sounds.  Exam reveals no gallop and no friction rub.   No murmur heard. Pulmonary/Chest: Effort normal and breath sounds normal. No respiratory distress. She has no wheezes. She has no rales. She exhibits no tenderness.  Abdominal: Soft. She exhibits no distension and no mass. There is no tenderness. There is no rebound and no guarding.  Musculoskeletal: Normal range of motion. She exhibits no edema or tenderness.  Normal  gait.  Neurological: She is alert and oriented to person, place, and time. She has normal reflexes.  CN 3-12 intact, normal finger to nose, no pronator drift, sensation and strength intact bilaterally.  Skin: Skin is warm and dry.  Psychiatric: She has a normal mood and affect. Her behavior is normal. Judgment and thought content normal.  Nursing note and vitals reviewed.    ED Treatments / Results  Labs (all labs ordered are listed, but only abnormal results are displayed) Labs Reviewed  URINALYSIS, ROUTINE W REFLEX MICROSCOPIC - Abnormal; Notable for the following:       Result Value   Color, Urine COLORLESS (*)    Specific Gravity, Urine 1.002 (*)    Hgb urine dipstick SMALL (*)    All other components within normal limits  CBC WITH DIFFERENTIAL/PLATELET  COMPREHENSIVE METABOLIC PANEL  I-STAT BETA HCG BLOOD, ED ([facility], [facility], [facility] ONLY)    EKG  EKG Interpretation None       Radiology Mr [name] Wo Contrast  Result Date: 07/15/2017 CLINICAL DATA:  31 year old female with intractable headache, left facial numbness. Possible migraine. EXAM: MRI HEAD WITHOUT AND WITH CONTRAST TECHNIQUE: Multiplanar, multiecho pulse sequences of the brain and surrounding structures were obtained without and with intravenous contrast. CONTRAST:  79mL MULTIHANCE GADOBENATE DIMEGLUMINE 529 MG/ML IV SOLN COMPARISON:  None. FINDINGS: Brain: There are fairly numerous foci of abnormal cerebral white matter T2 and FLAIR hyperintensity. Multiple periventricular lesions are demonstrated, and several of these have a perpendicular orientation to the ventricles (Dawson fingers series 10, image 99). There is marginal involvement of the body of the left corpus callosum. There are scattered subcortical white matter lesions, including in both temporal lobes. The largest lesion is a nearly 2 cm T2 and FLAIR hyperintense area in the left cerebellar peduncle (series 6, image 10). Most of the lesions demonstrate increased trace  diffusion signal, but only a right occipital periventricular white matter lesion appears restricted on diffusion (series 350, image 30). However, following contrast administration multiple of the lesions are seen to enhance. The left cerebellar peduncle lesion demonstrates an enhancing area of 13 mm. At least 7 other subcentimeter enhancing white matter lesions are scattered in both hemispheres (series 12 and series 13. Despite the size and enhancement of the above lesions, there is no associated mass effect, and no surrounding cerebral edema. No definite cortical involvement is identified. There is no involvement of the deep gray matter nuclei. The brainstem and cerebellum appear spared except for the left cerebellar peduncle. The cervicomedullary junction and visible cervical spine appear grossly normal. No vascular territory areas of restricted diffusion to suggest acute infarction. No midline shift, ventriculomegaly, extra-axial collection or acute intracranial hemorrhage. Cervicomedullary junction and pituitary are within normal limits. No chronic cerebral blood products or mineralization. Vascular: Major intracranial vascular flow voids are preserved. Skull and  upper cervical spine: Grossly negative visualized cervical spine. Visualized bone marrow signal is within normal limits. Sinuses/Orbits: No optic nerve or orbit signal abnormality is evident. Paranasal sinuses are clear. Other: Mastoid air cells are clear. Visible internal auditory structures appear normal. Negative scalp and face soft tissues. IMPRESSION: 1. Fairly numerous T2 and FLAIR lesions in the cerebral white matter, and nearly 2 cm lesion of the left cerebellar peduncle. The constellation of findings is is strongly suggestive of Multiple Sclerosis. 2. At least 8 lesions, including that in the left cerebellar peduncle, are enhancing indicative of active demyelination. 3. Preliminary report of this study was discussed by telephone with Dr. Nilda Simmer, who is covering for Dr. Edwina Barth , at 1555 hours today. Electronically Signed   By: Odessa Fleming M.D.   On: 07/15/2017 16:24    Procedures Procedures (including critical care time)  Medications Ordered in ED Medications - No data to display   Initial Impression / Assessment and Plan / ED Course  I have reviewed the triage vital signs and the nursing notes.  Pertinent labs & imaging results that were available during my care of the patient were reviewed by me and considered in my medical decision making (see chart for details).     Patient with facial numbness x 2 weeks and MRI with finding concerning for MS.  Neurovascularly intact now.  Appreciate Dr. Otelia Limes for consult.  Recommends 1000 mg of solumedrol and Hospitalist admission.  Discussed patient with Dr. Otelia Limes, who agrees that this likely an acute exacerbation of new onset MS. Appreciate Dr. Criselda Peaches for admitting the patient to the hospital.  Final Clinical Impressions(s) / ED Diagnoses   Final diagnoses:  MS (multiple sclerosis) [facility])    New Prescriptions Current Discharge Medication List       Roxy Horseman, Cordelia Poche 07/16/17 0602    Zadie Rhine, MD 07/16/17 [phone]

## 2017-07-15 NOTE — Progress Notes (Signed)
Michele Lane 31 y.o.   Chief Complaint  Patient presents with  . Follow-up    07/09/17  . Emesis    started this morning    HISTORY OF PRESENT ILLNESS: [name] complaining of vomiting twice this am; seen by me 07/09/17; feels better but today she threw up twice. Scheduled for MRI today.  HPI   Prior to Admission medications   Medication Sig Start Date End Date Taking? Authorizing Michele Lane  butalbital-acetaminophen-caffeine Michele Lane) 50-325-40 MG tablet Take 1 tablet by mouth every 6 (six) hours as needed for headache. 07/15/17  Yes Michele Lane, Michele Kempf, MD  diazepam (VALIUM) 5 MG tablet Take 1 tablet (5 mg total) by mouth 2 (two) times daily. As needed. 07/15/17  Yes Michele Lane, Michele Kempf, MD  levonorgestrel-ethinyl estradiol (SEASONALE,INTROVALE,JOLESSA) 0.15-0.03 MG tablet Take 1 tablet by mouth daily. 11/28/15  Yes Michele Lane, Historical, MD    No Known Allergies  Patient Active Problem List   Diagnosis Date Noted  . Facial numbness 07/09/2017  . Intractable headache 07/09/2017  . Dizziness 07/09/2017  . General weakness 07/09/2017  . 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 History of hypoglycemia 02/06/2013  . Uses oral contraception 02/06/2013    No past medical history on file.  No past surgical history on file.  Social History   Social History  . Marital status: Single    Spouse name: N/A  . Number of children: N/A  . Years of education: N/A   Occupational History  . Not on file.   Social History Main Topics  . Smoking status: Never Smoker  . Smokeless tobacco: Never Used  . Alcohol use Yes  . Drug use: No  . Sexual activity: Yes    Birth control/ protection: Pill   Other Topics Concern  . Not on file   Social History Narrative   [employer] - class scheduler    Family History  Problem Relation Age of Onset  . Diabetes Maternal Grandfather      Review of Systems  Constitutional: Negative.  Negative for chills and fever.  HENT: Negative.     Eyes: Negative.   Respiratory: Negative.  Negative for cough and shortness of breath.   Cardiovascular: Negative.  Negative for chest pain and palpitations.  Gastrointestinal: Negative.  Negative for abdominal pain, nausea and vomiting.  Musculoskeletal: Negative.   Skin: Negative.  Negative for rash.  Neurological: Negative.  Negative for dizziness and headaches.  Endo/Heme/Allergies: Negative.   All other systems reviewed and are negative.  Vitals:   07/15/17 1148  BP: 107/67  Pulse: (!) 41  Resp: 16  Temp: (!) 97.4 F (36.3 C)     Physical Exam  Constitutional: She is oriented to person, place, and time. She appears well-developed and well-nourished.  HENT:  Head: Normocephalic and atraumatic.  Eyes: Pupils are equal, round, and reactive to light. Conjunctivae and EOM are normal.  Neck: Normal range of motion. Neck supple.  Cardiovascular: Normal rate, regular rhythm and normal heart sounds.   Pulmonary/Chest: Effort normal and breath sounds normal.  Abdominal: Soft. Bowel sounds are normal. She exhibits no distension. There is no tenderness.  Musculoskeletal: Normal range of motion.  Neurological: She is alert and oriented to person, place, and time. No sensory deficit. She exhibits normal muscle tone.  Skin: Skin is warm and dry. Capillary refill takes less than 2 seconds.  Psychiatric: She has a normal mood and affect. Her behavior is normal.  Vitals reviewed.    ASSESSMENT & PLAN:  Michele Lane was seen today for follow-up and emesis.  Diagnoses and all orders for this visit:  Nausea and vomiting, intractability of vomiting not specified, unspecified vomiting type -     ondansetron (ZOFRAN) injection 2 mg; Inject 1 mL (2 mg total) into the muscle once.  Intractable headache, unspecified chronicity pattern, unspecified headache type Comments: better  Other orders -     diazepam (VALIUM) 5 MG tablet; Take 1 tablet (5 mg total) by mouth 2 (two) times daily. As  needed. -     butalbital-acetaminophen-caffeine (ESGIC) 50-325-40 MG tablet; Take 1 tablet by mouth every 6 (six) hours as needed for headache. -     ondansetron (ZOFRAN) 4 MG tablet; Take 1 tablet (4 mg total) by mouth every 8 (eight) hours as needed for nausea or vomiting.    Patient Instructions       IF you received an x-ray today, you will receive an invoice from [facility]. Please contact [facility] at [phone] with questions or concerns regarding your invoice.   IF you received labwork today, you will receive an invoice from [place]. Please contact [facility] at [phone] with questions or concerns regarding your invoice.   Our billing staff will not be able to assist you with questions regarding bills from these companies.  You will be contacted with the lab results as soon as they are available. The fastest way to get your results is to activate your My Chart account. Instructions are located on the last page of this paperwork. If you have not heard from [place] regarding the results in 2 weeks, please contact this office.     Nausea and Vomiting, Adult Feeling sick to your stomach (nausea) means that your stomach is upset or you feel like you have to throw up (vomit). Feeling more and more sick to your stomach can lead to throwing up. Throwing up happens when food and liquid from your stomach are thrown up and out the mouth. Throwing up can make you feel weak and cause you to get dehydrated. Dehydration can make you tired and thirsty, make you have a dry mouth, and make it so you pee (urinate) less often. Older adults and people with other diseases or a weak defense system (immune system) are at higher risk for dehydration. If you feel sick to your stomach or if you throw up, it is important to follow instructions from your doctor about how to take care of yourself. Follow these instructions at home: Eating and drinking Follow these instructions as told by  your doctor:  Take an oral rehydration solution (ORS). This is a drink that is sold at pharmacies and stores.  Drink clear fluids in small amounts as you are able, such as: ? Water. ? Ice chips. ? Diluted fruit juice. ? Low-calorie sports drinks.  Eat bland, easy-to-digest foods in small amounts as you are able, such as: ? Bananas. ? Applesauce. ? Rice. ? Low-fat (lean) meats. ? Toast. ? Crackers.  Avoid fluids that have a lot of sugar or caffeine in them.  Avoid alcohol.  Avoid spicy or fatty foods.  General instructions  Drink enough fluid to keep your pee (urine) clear or pale yellow.  Wash your hands often. If you cannot use soap and water, use hand sanitizer.  Make sure that all people in your home wash their hands well and often.  Take over-the-counter and prescription medicines only as told by your doctor.  Rest at home while you get better.  Watch your  condition for any changes.  Breathe slowly and deeply when you feel sick to your stomach.  Keep all follow-up visits as told by your doctor. This is important. Contact a doctor if:  You have a fever.  You cannot keep fluids down.  Your symptoms get worse.  You have new symptoms.  You feel sick to your stomach for more than two days.  You feel light-headed or dizzy.  You have a headache.  You have muscle cramps. Get help right away if:  You have pain in your chest, neck, arm, or jaw.  You feel very weak or you pass out (faint).  You throw up again and again.  You see blood in your throw-up.  Your throw-up looks like black coffee grounds.  You have bloody or black poop (stools) or poop that look like tar.  You have a very bad headache, a stiff neck, or both.  You have a rash.  You have very bad pain, cramping, or bloating in your belly (abdomen).  You have trouble breathing.  You are breathing very quickly.  Your heart is beating very quickly.  Your skin feels cold and  clammy.  You feel confused.  You have pain when you pee.  You have signs of dehydration, such as: ? Dark pee, hardly any pee, or no pee. ? Cracked lips. ? Dry mouth. ? Sunken eyes. ? Sleepiness. ? Weakness. These symptoms may be an emergency. Do not wait to see if the symptoms will go away. Get medical help right away. Call your local emergency services (911 in the [place] Do not drive yourself to the hospital. This information is not intended to replace advice given to you by your health care Michele Lane. Make sure you discuss any questions you have with your health care Michele Lane. Document Released: 05/31/2008 Document Revised: 07/02/2016 Document Reviewed: 08/19/2015 Elsevier Interactive Patient Education  2018 Elsevier Inc.      Edwina Barth, MD [facility]

## 2017-07-15 NOTE — Patient Instructions (Addendum)
   IF you received an x-ray today, you will receive an invoice from [facility]. Please contact [facility] at [phone] with questions or concerns regarding your invoice.   IF you received labwork today, you will receive an invoice from [facility]. Please contact [facility] at [phone] with questions or concerns regarding your invoice.   Our billing staff will not be able to assist you with questions regarding bills from these companies.  You will be contacted with the lab results as soon as they are available. The fastest way to get your results is to activate your My Chart account. Instructions are located on the last page of this paperwork. If you have not heard from us regarding the results in 2 weeks, please contact this office.     Nausea and Vomiting, Adult Feeling sick to your stomach (nausea) means that your stomach is upset or you feel like you have to throw up (vomit). Feeling more and more sick to your stomach can lead to throwing up. Throwing up happens when food and liquid from your stomach are thrown up and out the mouth. Throwing up can make you feel weak and cause you to get dehydrated. Dehydration can make you tired and thirsty, make you have a dry mouth, and make it so you pee (urinate) less often. Older adults and people with other diseases or a weak defense system (immune system) are at higher risk for dehydration. If you feel sick to your stomach or if you throw up, it is important to follow instructions from your doctor about how to take care of yourself. Follow these instructions at home: Eating and drinking Follow these instructions as told by your doctor:  Take an oral rehydration solution (ORS). This is a drink that is sold at pharmacies and stores.  Drink clear fluids in small amounts as you are able, such as: ? Water. ? Ice chips. ? Diluted fruit juice. ? Low-calorie sports drinks.  Eat bland, easy-to-digest foods in small amounts as you  are able, such as: ? Bananas. ? Applesauce. ? Rice. ? Low-fat (lean) meats. ? Toast. ? Crackers.  Avoid fluids that have a lot of sugar or caffeine in them.  Avoid alcohol.  Avoid spicy or fatty foods.  General instructions  Drink enough fluid to keep your pee (urine) clear or pale yellow.  Wash your hands often. If you cannot use soap and water, use hand sanitizer.  Make sure that all people in your home wash their hands well and often.  Take over-the-counter and prescription medicines only as told by your doctor.  Rest at home while you get better.  Watch your condition for any changes.  Breathe slowly and deeply when you feel sick to your stomach.  Keep all follow-up visits as told by your doctor. This is important. Contact a doctor if:  You have a fever.  You cannot keep fluids down.  Your symptoms get worse.  You have new symptoms.  You feel sick to your stomach for more than two days.  You feel light-headed or dizzy.  You have a headache.  You have muscle cramps. Get help right away if:  You have pain in your chest, neck, arm, or jaw.  You feel very weak or you pass out (faint).  You throw up again and again.  You see blood in your throw-up.  Your throw-up looks like black coffee grounds.  You have bloody or black poop (stools) or poop that look like tar.  You   have a very bad headache, a stiff neck, or both.  You have a rash.  You have very bad pain, cramping, or bloating in your belly (abdomen).  You have trouble breathing.  You are breathing very quickly.  Your heart is beating very quickly.  Your skin feels cold and clammy.  You feel confused.  You have pain when you pee.  You have signs of dehydration, such as: ? Dark pee, hardly any pee, or no pee. ? Cracked lips. ? Dry mouth. ? Sunken eyes. ? Sleepiness. ? Weakness. These symptoms may be an emergency. Do not wait to see if the symptoms will go away. Get medical help  right away. Call your local emergency services (911 in the [place] Do not drive yourself to the hospital. This information is not intended to replace advice given to you by your health care provider. Make sure you discuss any questions you have with your health care provider. Document Released: 05/31/2008 Document Revised: 07/02/2016 Document Reviewed: 08/19/2015 Elsevier Interactive Patient Education  2018 Elsevier Inc.  

## 2017-07-15 NOTE — ED Notes (Signed)
Pt reports that she was diagnosed with MS this morning. Pt had an MRI this afternoon with neurology confirming diagnosis.

## 2017-07-16 ENCOUNTER — Inpatient Hospital Stay (HOSPITAL_COMMUNITY): Payer: BC Managed Care – PPO

## 2017-07-16 ENCOUNTER — Encounter (HOSPITAL_COMMUNITY): Payer: Self-pay | Admitting: Internal Medicine

## 2017-07-16 DIAGNOSIS — I340 Nonrheumatic mitral (valve) insufficiency: Secondary | ICD-10-CM | POA: Diagnosis not present

## 2017-07-16 DIAGNOSIS — R200 Anesthesia of skin: Secondary | ICD-10-CM | POA: Diagnosis present

## 2017-07-16 DIAGNOSIS — G43001 Migraine without aura, not intractable, with status migrainosus: Secondary | ICD-10-CM | POA: Diagnosis present

## 2017-07-16 DIAGNOSIS — G35 Multiple sclerosis: Secondary | ICD-10-CM | POA: Diagnosis present

## 2017-07-16 DIAGNOSIS — R001 Bradycardia, unspecified: Secondary | ICD-10-CM | POA: Diagnosis present

## 2017-07-16 DIAGNOSIS — Z833 Family history of diabetes mellitus: Secondary | ICD-10-CM | POA: Diagnosis not present

## 2017-07-16 DIAGNOSIS — R42 Dizziness and giddiness: Secondary | ICD-10-CM | POA: Diagnosis present

## 2017-07-16 DIAGNOSIS — I4581 Long QT syndrome: Secondary | ICD-10-CM | POA: Diagnosis present

## 2017-07-16 DIAGNOSIS — Z8051 Family history of malignant neoplasm of kidney: Secondary | ICD-10-CM | POA: Diagnosis not present

## 2017-07-16 LAB — CREATININE, SERUM
Creatinine, Ser: 0.8 mg/dL (ref 0.44–1.00)
GFR calc Af Amer: >60 mL/min (ref >60)
GFR calc non Af Amer: >60 mL/min (ref >60)

## 2017-07-16 LAB — CBC
HEMATOCRIT: 37 % (ref 36.0–46.0)
HEMOGLOBIN: 12.3 g/dL (ref 12.0–15.0)
MCH: 27.8 pg (ref 26.0–34.0)
MCHC: 33.2 g/dL (ref 30.0–36.0)
MCV: 83.5 fL (ref 78.0–100.0)
Platelets: 280 10*3/uL (ref 150–400)
RBC: 4.43 MIL/uL (ref 3.87–5.11)
RDW: 13.5 % (ref 11.5–15.5)
WBC: 8.6 10*3/uL (ref 4.0–10.5)

## 2017-07-16 LAB — TSH: TSH: 2.218 u[IU]/mL (ref 0.350–4.500)

## 2017-07-16 LAB — MRSA PCR SCREENING: MRSA by PCR: NEGATIVE

## 2017-07-16 LAB — GLUCOSE, CAPILLARY: GLUCOSE-CAPILLARY: 119 mg/dL — AB (ref 65–99)

## 2017-07-16 MED ORDER — TRAMADOL HCL 50 MG PO TABS
50.0000 mg | ORAL_TABLET | Freq: Four times a day (QID) | ORAL | Status: DC | PRN
  Administered 2017-07-16: 50 mg via ORAL
  Filled 2017-07-16: qty 1

## 2017-07-16 MED ORDER — IBUPROFEN 200 MG PO TABS: 200.0000 mg | ORAL_TABLET | Freq: Four times a day (QID) | ORAL | Status: DC | PRN

## 2017-07-16 MED ORDER — SODIUM CHLORIDE 0.9% FLUSH
3.0000 mL | Freq: Two times a day (BID) | INTRAVENOUS | Status: DC
  Administered 2017-07-16 – 2017-07-19 (×6): 3 mL via INTRAVENOUS

## 2017-07-16 MED ORDER — PANTOPRAZOLE SODIUM 40 MG PO TBEC
40.0000 mg | DELAYED_RELEASE_TABLET | Freq: Every day | ORAL | Status: DC
  Administered 2017-07-16 – 2017-07-19 (×4): 40 mg via ORAL
  Filled 2017-07-16 (×4): qty 1

## 2017-07-16 MED ORDER — ONDANSETRON HCL 4 MG PO TABS
4.0000 mg | ORAL_TABLET | Freq: Three times a day (TID) | ORAL | Status: DC | PRN
  Administered 2017-07-16 (×2): 4 mg via ORAL
  Filled 2017-07-16: qty 1

## 2017-07-16 MED ORDER — SODIUM CHLORIDE 0.9 % IV SOLN
1000.0000 mg | INTRAVENOUS | Status: DC
  Administered 2017-07-16 – 2017-07-18 (×2): 1000 mg via INTRAVENOUS
  Filled 2017-07-16 (×2): qty 8

## 2017-07-16 MED ORDER — PROCHLORPERAZINE EDISYLATE 5 MG/ML IJ SOLN
10.0000 mg | Freq: Four times a day (QID) | INTRAMUSCULAR | Status: DC
  Administered 2017-07-16 – 2017-07-17 (×6): 10 mg via INTRAVENOUS
  Filled 2017-07-16 (×6): qty 2

## 2017-07-16 MED ORDER — PNEUMOCOCCAL VAC POLYVALENT 25 MCG/0.5ML IJ INJ: 0.5000 mL | INJECTION | INTRAMUSCULAR | Status: DC | PRN

## 2017-07-16 MED ORDER — DIAZEPAM 5 MG PO TABS: 5.0000 mg | ORAL_TABLET | Freq: Two times a day (BID) | ORAL | Status: DC | PRN

## 2017-07-16 MED ORDER — IBUPROFEN 200 MG PO TABS
200.0000 mg | ORAL_TABLET | Freq: Four times a day (QID) | ORAL | Status: DC | PRN
  Filled 2017-07-16: qty 2

## 2017-07-16 MED ORDER — ENOXAPARIN SODIUM 40 MG/0.4ML ~~LOC~~ SOLN
40.0000 mg | SUBCUTANEOUS | Status: DC
  Administered 2017-07-17 – 2017-07-19 (×4): 40 mg via SUBCUTANEOUS
  Filled 2017-07-16 (×4): qty 0.4

## 2017-07-16 MED ORDER — SODIUM CHLORIDE 0.9 % IV SOLN
INTRAVENOUS | Status: DC
  Administered 2017-07-16: 02:00:00 via INTRAVENOUS

## 2017-07-16 MED ORDER — BUTALBITAL-APAP-CAFFEINE 50-325-40 MG PO TABS
1.0000 | ORAL_TABLET | Freq: Four times a day (QID) | ORAL | Status: DC | PRN
  Administered 2017-07-16 – 2017-07-19 (×2): 1 via ORAL
  Filled 2017-07-16 (×3): qty 1

## 2017-07-16 MED ORDER — IBUPROFEN 200 MG PO TABS
200.0000 mg | ORAL_TABLET | Freq: Four times a day (QID) | ORAL | Status: DC | PRN
  Administered 2017-07-16: 200 mg via ORAL

## 2017-07-16 MED ORDER — LEVONORGEST-ETH ESTRAD 91-DAY 0.15-0.03 MG PO TABS: 1.0000 | ORAL_TABLET | Freq: Every day | ORAL | Status: DC

## 2017-07-16 MED ORDER — KETOROLAC TROMETHAMINE 30 MG/ML IJ SOLN
30.0000 mg | Freq: Once | INTRAMUSCULAR | Status: AC
  Administered 2017-07-16: 30 mg via INTRAVENOUS
  Filled 2017-07-16: qty 1

## 2017-07-16 MED ORDER — DIAZEPAM 5 MG PO TABS
5.0000 mg | ORAL_TABLET | Freq: Three times a day (TID) | ORAL | Status: DC | PRN
  Administered 2017-07-16 – 2017-07-18 (×3): 5 mg via ORAL
  Filled 2017-07-16 (×3): qty 1

## 2017-07-16 MED ORDER — DIPHENHYDRAMINE HCL 50 MG/ML IJ SOLN
25.0000 mg | Freq: Once | INTRAMUSCULAR | Status: AC
  Administered 2017-07-16: 25 mg via INTRAVENOUS
  Filled 2017-07-16: qty 1

## 2017-07-16 NOTE — Progress Notes (Signed)
[facility] TEAM 1 - Stepdown/ICU TEAM  Michele Lane  PJA:[id number] DOB: Sep 02, 1986 DOA: 07/15/2017 PCP: Georgina Quint, MD    Brief Narrative:  31 y.o. female with a history of Hypoglycemic episodes and migraines who presented w/ facial numbness and dizziness.  2 weeks prior to this admit she developed lip numbness on her left bottom lip.  The next day, she was dizzy and off balance.  She subsequently developed headache w/ worsening facial numbness and dizziness.  She presented to the ED on Tuesday 7/10 in [place].  While in the ED, she was noted to have episodes of bradycardia.  She was given medication for headache and sent home.  By Thursday 7/12 she was worse and had developed nausea.  She returned to the ED.  There was concern for complex migraine and the provider advised her to come home to [facility], see her PCP, and possibly get an MRI.  She saw her PCP, and had an MRI 7/20 which showed a constellation of findings strongly suggestive of MS.  There was also concern for active demyelination.  She presented to the ED for acute treatment.    Subjective: Pt is seen for a f/u visit.    Assessment & Plan:  Newly diagnosed MS  Status migrainous   Sinus bradycardia   DVT prophylaxis: lovenox  Code Status: FULL CODE Family Communication:  Disposition Plan: stable for transfer to tele bed   Consultants:  Neurology   Procedures: none  Antimicrobials:  none   Objective: Blood pressure 119/70, pulse 71, temperature (!) 97.4 F (36.3 C), temperature source Oral, resp. rate 17, height 5\' 5"  (1.651 m), weight 62.8 kg (138 lb 7.2 oz), last menstrual period 07/14/2017, SpO2 97 %.  Intake/Output Summary (Last 24 hours) at 07/16/17 1059 Last data filed at 07/16/17 0400  Gross per 24 hour  Intake              423 ml  Output                0 ml  Net              423 ml   Filed Weights   07/15/17 2023 07/16/17 0200  Weight: 62.6 kg (138 lb) 62.8 kg (138 lb 7.2 oz)     Examination: Pt examined/admitted earlier today.    CBC:  Recent Labs Lab 07/09/17 1150 07/15/17 2028 07/16/17 0221  WBC 6.5 8.5 8.6  NEUTROABS 4.5 4.8  --   HGB 12.8 13.1 12.3  HCT 40.2 39.9 37.0  MCV 81 83.8 83.5  PLT 271 296 280   Basic Metabolic Panel:  Recent Labs Lab 07/09/17 1150 07/15/17 2028 07/16/17 0221  NA 147* 137  --   K 4.3 4.2  --   CL 109* 104  --   CO2 16* 26  --   GLUCOSE 106* 99  --   BUN 10 9  --   CREATININE 0.77 0.84 0.80  CALCIUM 9.7 9.7  --    GFR: Estimated Creatinine Clearance: 90 mL/min (by C-G formula based on SCr of 0.8 mg/dL).  Liver Function Tests:  Recent Labs Lab 07/09/17 1150 07/15/17 2028  AST 25 25  ALT 11 25  ALKPHOS 54 53  BILITOT 0.3 0.4  PROT 7.0 7.2  ALBUMIN 4.4 4.2   CBG:  Recent Labs Lab 07/16/17 0534  GLUCAP 119*    Recent Results (from the past 240 hour(s))  MRSA PCR Screening     Status:  None   Collection Time: 07/16/17  1:54 AM  Result Value Ref Range Status   MRSA by PCR NEGATIVE NEGATIVE Final    Comment:        The GeneXpert MRSA Assay (FDA approved for NASAL specimens only), is one component of a comprehensive MRSA colonization surveillance program. It is not intended to diagnose MRSA infection nor to guide or monitor treatment for MRSA infections.      Scheduled Meds: . diphenhydrAMINE  25 mg Intravenous Once  . enoxaparin (LOVENOX) injection  40 mg Subcutaneous Q24H  . ketorolac  30 mg Intravenous Once  . levonorgestrel-ethinyl estradiol  1 tablet Oral Daily  . prochlorperazine  10 mg Intravenous Q6H  . sodium chloride flush  3 mL Intravenous Q12H   Continuous Infusions: . sodium chloride 75 mL/hr at 07/16/17 0220     LOS: 0 days   Time spent: No Charge  Lonia Blood, MD [facility] Office  [phone] Pager - Text Page per Loretha Stapler as per below:  On-Call/Text Page:      [email or web address]      password [email or web address]  If 7PM-7AM, please contact  night-coverage [email or web address] Password [email or web address] 07/16/2017, 10:59 AM

## 2017-07-16 NOTE — Progress Notes (Signed)
Subjective: Awake, alert, complains of headache  Exam: Vitals:   07/16/17 0347 07/16/17 0818  BP: 108/65 119/70  Pulse: 71   Resp: 17   Temp: 98.1 F (36.7 C) (!) 97.4 F (36.3 C)   Gen: In bed, NAD Resp: non-labored breathing, no acute distress Abd: soft, nt  Neuro: MS: awake, alert, oriented EE:ATVVL, EOMI Motor: 5/5 strength Sensory:intact to LT Cerebellar: mild left sided ataxia  Pertinent Labs: CBC,BMP-unremarkable  Impression: 31 yo F with first clinical episode and imaging consistent with multiple sclerosis. Given the presence of both enhancing and non-enhancing lesions, and distribution in space, I feel that MS is by far the most likely diagnosis. With negative ESR, ANA I would favor treating as MS at this time.   Recommendations: 1) Solumedrol 1g x 5 days, today is day 2 2) MRI cervical and thoracic spien w/wo contrast.  3) compazine/benadryl/toradol for headache  Roland Rack, MD [facility] [phone]  If 7pm- 7am, please page neurology on call as listed in [place].

## 2017-07-16 NOTE — H&P (Signed)
History and Physical    Michele Lane WUJ:[id number] DOB: 1986-08-14 DOA: 07/15/2017  PCP: Georgina Quint, MD  Patient coming from: Home  Chief Complaint: Facial numbness  HPI: Michele Lane is a 31 y.o. female with medical history significant of Hypoglycemic episodes, migraines who presents for facial numbness and dizziness.  Michele Lane reports that about 2 weeks ago she initially developed lip numbness on her left bottom lip.  The next day, she was dizzy and off balance.  She subsequently developed headache, worsening facial numbness and dizziness.  She presented to the ED on Tuesday 7/10 in [place] (was with her mother) where she was worked up for heart attack and a stroke.  While in the ED, she was noted to have episodes of bradycardia, hence the cardiac work up.  She reports that everything was noted to be fine and she was given medication for headache and sent home.  By Thursday she was worse and had developed nausea.  She had gotten to the point where she was crawling because of the dizziness.  She presented again to the ED, there was concern for complex migraine and the provider there advised her to come home to [facility], see her PCP and possibly get an MRI.  She was again given migraine medication, Zofran and she was able to come back to [place].  She saw her PCP, and had an MRI today which showed a constellation of findings strongly suggestive of MS.  There was also concern for active demyelination.  She presented to the ED due to these findings for acute treatment.    Of note, while I was in the room she had multiple episodes of bradycardia down into the 20s and 30s on the monitor.  This was confirmed by palpation of the radial arteries.  She was asymptomatic through these ( though she was lying down ).  She continued to have a cogent conversation and did not develop dizziness, lightheadedness or chest pain.  These episodes lasted 30 sec to 1 min and then resolved to a normal HR in  the 60s-70s.  This happened twice while I was in the room.    ED Course: In the ED, her blood work was found to be completely normal. I reviewed her MRI.   Review of Systems: As per HPI otherwise 10 point review of systems negative.   Past Medical History:  Diagnosis Date  . Hypoglycemia   . Migraine     History reviewed. No pertinent surgical history.   She has never had surgery.    reports that she has never smoked. She has never used smokeless tobacco. She reports that she drinks about 0.6 oz of alcohol per week . She reports that she does not use drugs.  No Known Allergies  Reviewed with patient.  Family History  Problem Relation Age of Onset  . Neuropathy Mother   . Kidney cancer Father   . Diabetes Maternal Grandfather     Prior to Admission medications   Medication Sig Start Date End Date Taking? Authorizing Provider  butalbital-acetaminophen-caffeine [facility]) 50-325-40 MG tablet Take 1 tablet by mouth every 6 (six) hours as needed for headache. 07/15/17  Yes Sagardia, Eilleen Kempf, MD  diazepam (VALIUM) 5 MG tablet Take 1 tablet (5 mg total) by mouth 2 (two) times daily. As needed. Patient taking differently: Take 5 mg by mouth every 12 (twelve) hours as needed for anxiety or muscle spasms. As needed. 07/15/17  Yes Georgina Quint, MD  ibuprofen (  ADVIL,MOTRIN) 200 MG tablet Take 200-400 mg by mouth every 6 (six) hours as needed for moderate pain.   Yes [provider]  levonorgestrel-ethinyl estradiol (SEASONALE,INTROVALE,JOLESSA) 0.15-0.03 MG tablet Take 1 tablet by mouth daily. 11/28/15  Yes [provider]  ondansetron (ZOFRAN) 4 MG tablet Take 1 tablet (4 mg total) by mouth every 8 (eight) hours as needed for nausea or vomiting. 07/15/17  Yes Georgina Quint, MD    Physical Exam: Vitals:   07/15/17 2022 07/15/17 2023 07/15/17 2245 07/15/17 2330  BP: 112/64  119/76 118/67  Pulse: (!) 58  82 (!) 53  Resp: 16  17 17   Temp: 98.5 F (36.9 C)      TempSrc: Oral     SpO2: 98%  100% 100%  Weight:  138 lb (62.6 kg)    Height:  5\' 5"  (1.651 m)      Constitutional: NAD, calm, comfortable Vitals:   07/15/17 2022 07/15/17 2023 07/15/17 2245 07/15/17 2330  BP: 112/64  119/76 118/67  Pulse: (!) 58  82 (!) 53  Resp: 16  17 17   Temp: 98.5 F (36.9 C)     TempSrc: Oral     SpO2: 98%  100% 100%  Weight:  138 lb (62.6 kg)    Height:  5\' 5"  (1.651 m)     Eyes: PERRL, lids and conjunctivae normal ENMT: Mucous membranes are moist. Posterior pharynx clear of any exudate or lesions. Normal dentition.  Neck: normal, supple Respiratory: clear to auscultation bilaterally, no wheezing, no crackles. Normal respiratory effort.   Cardiovascular: She had episodes of bradycardia, alternating with a regular rhythm, no murmurs / rubs / gallops. No extremity edema. 2+ pedal pulses.  Abdomen: no tenderness, no masses palpated. Bowel sounds positive.  Musculoskeletal: no clubbing / cyanosis. No joint deformity upper and lower extremities.  Normal muscle tone.  Skin: no rashes, lesions, ulcers.  Neurologic: CN 2-12 grossly intact. Sensation decreased on left cheek and jaw. Strength 5/5 in all 4.  Finger to nose normal.  Psychiatric: Normal judgment and insight. Alert and oriented x 3. Normal mood.     Labs on Admission: I have personally reviewed following labs and imaging studies  CBC:  Recent Labs Lab 07/09/17 1150 07/15/17 2028  WBC 6.5 8.5  NEUTROABS 4.5 4.8  HGB 12.8 13.1  HCT 40.2 39.9  MCV 81 83.8  PLT 271 296   Basic Metabolic Panel:  Recent Labs Lab 07/09/17 1150 07/15/17 2028  NA 147* 137  K 4.3 4.2  CL 109* 104  CO2 16* 26  GLUCOSE 106* 99  BUN 10 9  CREATININE 0.77 0.84  CALCIUM 9.7 9.7   GFR: Estimated Creatinine Clearance: 85.7 mL/min (by C-G formula based on SCr of 0.84 mg/dL). Liver Function Tests:  Recent Labs Lab 07/09/17 1150 07/15/17 2028  AST 25 25  ALT 11 25  ALKPHOS 54 53  BILITOT 0.3 0.4  PROT  7.0 7.2  ALBUMIN 4.4 4.2   No results for input(s): LIPASE, AMYLASE in the last 168 hours. No results for input(s): AMMONIA in the last 168 hours. Coagulation Profile: No results for input(s): INR, PROTIME in the last 168 hours. Cardiac Enzymes: No results for input(s): CKTOTAL, CKMB, CKMBINDEX, TROPONINI in the last 168 hours. BNP (last 3 results) No results for input(s): PROBNP in the last 8760 hours. HbA1C: No results for input(s): HGBA1C in the last 72 hours. CBG: No results for input(s): GLUCAP in the last 168 hours. Lipid Profile: No results for  input(s): CHOL, HDL, LDLCALC, TRIG, CHOLHDL, LDLDIRECT in the last 72 hours. Thyroid Function Tests: No results for input(s): TSH, T4TOTAL, FREET4, T3FREE, THYROIDAB in the last 72 hours. Anemia Panel: No results for input(s): VITAMINB12, FOLATE, FERRITIN, TIBC, IRON, RETICCTPCT in the last 72 hours. Urine analysis:    Component Value Date/Time   COLORURINE COLORLESS (A) 07/15/2017 2024   APPEARANCEUR CLEAR 07/15/2017 2024   LABSPEC 1.002 (L) 07/15/2017 2024   PHURINE 6.0 07/15/2017 2024   GLUCOSEU NEGATIVE 07/15/2017 2024   HGBUR SMALL (A) 07/15/2017 2024   BILIRUBINUR NEGATIVE 07/15/2017 2024   KETONESUR NEGATIVE 07/15/2017 2024   PROTEINUR NEGATIVE 07/15/2017 2024   NITRITE NEGATIVE 07/15/2017 2024   LEUKOCYTESUR NEGATIVE 07/15/2017 2024    Radiological Exams on Admission: Mr [name] Wo Contrast  Result Date: 07/15/2017 CLINICAL DATA:  31 year old female with intractable headache, left facial numbness. Possible migraine. EXAM: MRI HEAD WITHOUT AND WITH CONTRAST TECHNIQUE: Multiplanar, multiecho pulse sequences of the brain and surrounding structures were obtained without and with intravenous contrast. CONTRAST:  15mL MULTIHANCE GADOBENATE DIMEGLUMINE 529 MG/ML IV SOLN COMPARISON:  None. FINDINGS: Brain: There are fairly numerous foci of abnormal cerebral white matter T2 and FLAIR hyperintensity. Multiple periventricular  lesions are demonstrated, and several of these have a perpendicular orientation to the ventricles (Dawson fingers series 10, image 99). There is marginal involvement of the body of the left corpus callosum. There are scattered subcortical white matter lesions, including in both temporal lobes. The largest lesion is a nearly 2 cm T2 and FLAIR hyperintense area in the left cerebellar peduncle (series 6, image 10). Most of the lesions demonstrate increased trace diffusion signal, but only a right occipital periventricular white matter lesion appears restricted on diffusion (series 350, image 30). However, following contrast administration multiple of the lesions are seen to enhance. The left cerebellar peduncle lesion demonstrates an enhancing area of 13 mm. At least 7 other subcentimeter enhancing white matter lesions are scattered in both hemispheres (series 12 and series 13. Despite the size and enhancement of the above lesions, there is no associated mass effect, and no surrounding cerebral edema. No definite cortical involvement is identified. There is no involvement of the deep gray matter nuclei. The brainstem and cerebellum appear spared except for the left cerebellar peduncle. The cervicomedullary junction and visible cervical spine appear grossly normal. No vascular territory areas of restricted diffusion to suggest acute infarction. No midline shift, ventriculomegaly, extra-axial collection or acute intracranial hemorrhage. Cervicomedullary junction and pituitary are within normal limits. No chronic cerebral blood products or mineralization. Vascular: Major intracranial vascular flow voids are preserved. Skull and upper cervical spine: Grossly negative visualized cervical spine. Visualized bone marrow signal is within normal limits. Sinuses/Orbits: No optic nerve or orbit signal abnormality is evident. Paranasal sinuses are clear. Other: Mastoid air cells are clear. Visible internal auditory structures  appear normal. Negative scalp and face soft tissues. IMPRESSION: 1. Fairly numerous T2 and FLAIR lesions in the cerebral white matter, and nearly 2 cm lesion of the left cerebellar peduncle. The constellation of findings is is strongly suggestive of Multiple Sclerosis. 2. At least 8 lesions, including that in the left cerebellar peduncle, are enhancing indicative of active demyelination. 3. Preliminary report of this study was discussed by telephone with Dr. Nilda Simmer, who is covering for Dr. Edwina Barth , at 1555 hours today. Electronically Signed   By: Odessa Fleming M.D.   On: 07/15/2017 16:24    EKG: Pending  Assessment/Plan Newly diagnosed Multiple sclerosis  with exacerbation - Neurology has been consulted - 1gm solumedrol daily, follow neurology recommendations - Neuro checks daily - Monitor daily for improvement - CBG daily for rising blood sugars in the setting of high dose steroids.     Bradycardia - She had interesting episodes of asymptomatic severe bradycardia.  - Apparently there are case reports of this happening in the literature with MS - Monitor in SDU, on telemetry - Check TSH  Atypical migraine - Continue ibuprofen and butalbitol-acetaminophen-caffeine PRN     DVT prophylaxis: Lovenox Code Status: Code Family Communication: Mom, Cindy at bedside Disposition Plan: Admit for high dose steroids, discharge in 2-4 days Consults called: Dr. Otelia Limes, Neurology   Admission status: SDU, inpatient   Debe Coder MD [facility] Pager [phone]  If 7PM-7AM, please contact night-coverage [email or web address] Password [facility]  07/16/2017, 12:39 AM

## 2017-07-16 NOTE — ED Notes (Signed)
Pt ambulatory to restroom. No complaints of dizziness or lightheadedness.

## 2017-07-17 ENCOUNTER — Inpatient Hospital Stay (HOSPITAL_COMMUNITY): Payer: BC Managed Care – PPO

## 2017-07-17 LAB — GLUCOSE, CAPILLARY
GLUCOSE-CAPILLARY: 152 mg/dL — AB (ref 65–99)
Glucose-Capillary: 136 mg/dL — ABNORMAL HIGH (ref 65–99)

## 2017-07-17 MED ORDER — GADOBENATE DIMEGLUMINE 529 MG/ML IV SOLN
15.0000 mL | Freq: Once | INTRAVENOUS | Status: AC
  Administered 2017-07-17: 15 mL via INTRAVENOUS

## 2017-07-17 MED ORDER — PROCHLORPERAZINE EDISYLATE 5 MG/ML IJ SOLN: 10.0000 mg | Freq: Four times a day (QID) | INTRAMUSCULAR | Status: DC | PRN

## 2017-07-17 NOTE — Progress Notes (Signed)
Subjective: Headache is much improved  Exam: Vitals:   07/17/17 0605 07/17/17 1610  BP: (!) 96/43 (!) 107/52  Pulse:    Resp:    Temp:  98 F (36.7 C)   Gen: In bed, NAD Resp: non-labored breathing, no acute distress Abd: soft, nt  Neuro: MS: awake, alert, oriented PN:SQZYT, EOMI Motor: 5/5 strength Sensory:intact to LT Cerebellar: mild left sided ataxia Has improved since yesterday  Pertinent Labs: CBC,BMP-unremarkable  Impression: 31 yo F with first clinical episode and imaging consistent with multiple sclerosis. Given the presence of both enhancing and non-enhancing lesions, and distribution in space, I feel that MS is by far the most likely diagnosis. With negative ESR, ANA I would favor treating as MS at this time. MRI cervical spine with a single lesion which is nonenhancing, keeping with the diagnosis of multiple sclerosis.  Recommendations: 1) Solumedrol 1g x 5 days, today is day 3 2) MRI cervical and thoracic spien w/wo contrast.  3) Compazine PRN for headache  Roland Rack, MD [facility] [phone]  If 7pm- 7am, please page neurology on call as listed in [place].

## 2017-07-17 NOTE — Progress Notes (Signed)
Patient ID: Michele Lane, female   DOB: [birth year], 31 y.o.   MRN: [id number]  PROGRESS NOTE    Michele Lane  WUJ:[id number] DOB: 1986/08/10 DOA: 07/15/2017  PCP: Georgina Quint, MD   Brief Narrative:  31 year old female, no significant past medical history, takes OCP and as needed ibuprofen. She started to have left facial numbness, headaches, dizziness and gait instability for past 2 weeks PTA. She had MRI brain on admission which was concerning for active demyelination due to MS. She was seen by neuro and started on IV solumedrol 1 gm daily for 5 days.    Assessment & Plan:   Active Problems: Headache / Facial numbness / Multiple sclerosis exacerbation (HCC) - MRI reviewed independently and with the pt and her family at the bedside. MRI showed fairly numerous lesions in the cerebral white matter, and nearly 2 cm lesion of the left cerebellar peduncle. The constellation of findings is strongly suggestive of Multiple Sclerosis. There are at least 8 lesions, including that in the left cerebellar peduncle,  indicative of active demyelination.  - Seen by neuro in consultation - Pt on solumedrol 1000 mg IV daily for total of 5 days. Today is day 3 - PT eval for gait instability  - Compazine or Toradol for headaches   Bradycardia - Asymptomatic - Continue to monitor    DVT prophylaxis: Lovenox subQ Code Status: full code  Family Communication: family at the bedside this am Disposition Plan: home once cleared by cardio   Consultants:   Neurology   Procedures:   None   Antimicrobials:   None    Subjective: No overnight events.  Objective: Vitals:   07/16/17 1637 07/16/17 2105 07/17/17 0442 07/17/17 0605  BP:  (!) 108/59 (!) 93/46 (!) 96/43  Pulse:  77 72   Resp:  16 18   Temp: (!) 97.5 F (36.4 C) 97.9 F (36.6 C) 98.5 F (36.9 C)   TempSrc: Axillary  Oral   SpO2:  98% 98%   Weight:  64.4 kg (142 lb)    Height:  5\' 5"  (1.651 m)      Intake/Output  Summary (Last 24 hours) at 07/17/17 0912 Last data filed at 07/17/17 0547  Gross per 24 hour  Intake              538 ml  Output                0 ml  Net              538 ml   Filed Weights   07/15/17 2023 07/16/17 0200 07/16/17 2105  Weight: 62.6 kg (138 lb) 62.8 kg (138 lb 7.2 oz) 64.4 kg (142 lb)    Examination:  General exam: Appears calm and comfortable  Respiratory system: Clear to auscultation. Respiratory effort normal. Cardiovascular system: S1 & S2 heard, RRR.  Gastrointestinal system: Abdomen is nondistended, soft and nontender. No organomegaly or masses felt. Normal bowel sounds heard. Central nervous system: Alert and oriented. Has some facial numbness, gait imbalance per pt but not tested since pt felt tired Extremities: Symmetric 5 x 5 power. Skin: No rashes, lesions or ulcers Psychiatry: Judgement and insight appear normal. Mood & affect appropriate.   Data Reviewed: I have personally reviewed following labs and imaging studies  CBC:  Recent Labs Lab 07/15/17 2028 07/16/17 0221  WBC 8.5 8.6  NEUTROABS 4.8  --   HGB 13.1 12.3  HCT 39.9 37.0  MCV 83.8 83.5  PLT 296 280   Basic Metabolic Panel:  Recent Labs Lab 07/15/17 2028 07/16/17 0221  NA 137  --   K 4.2  --   CL 104  --   CO2 26  --   GLUCOSE 99  --   BUN 9  --   CREATININE 0.84 0.80  CALCIUM 9.7  --    GFR: Estimated Creatinine Clearance: 90 mL/min (by C-G formula based on SCr of 0.8 mg/dL). Liver Function Tests:  Recent Labs Lab 07/15/17 2028  AST 25  ALT 25  ALKPHOS 53  BILITOT 0.4  PROT 7.2  ALBUMIN 4.2   No results for input(s): LIPASE, AMYLASE in the last 168 hours. No results for input(s): AMMONIA in the last 168 hours. Coagulation Profile: No results for input(s): INR, PROTIME in the last 168 hours. Cardiac Enzymes: No results for input(s): CKTOTAL, CKMB, CKMBINDEX, TROPONINI in the last 168 hours. BNP (last 3 results) No results for input(s): PROBNP in the last 8760  hours. HbA1C: No results for input(s): HGBA1C in the last 72 hours. CBG:  Recent Labs Lab 07/16/17 0534 07/17/17 0442 07/17/17 0744  GLUCAP 119* 136* 152*   Lipid Profile: No results for input(s): CHOL, HDL, LDLCALC, TRIG, CHOLHDL, LDLDIRECT in the last 72 hours. Thyroid Function Tests:  Recent Labs  07/16/17 0221  TSH 2.218   Anemia Panel: No results for input(s): VITAMINB12, FOLATE, FERRITIN, TIBC, IRON, RETICCTPCT in the last 72 hours. Urine analysis:    Component Value Date/Time   COLORURINE COLORLESS (A) 07/15/2017 2024   APPEARANCEUR CLEAR 07/15/2017 2024   LABSPEC 1.002 (L) 07/15/2017 2024   PHURINE 6.0 07/15/2017 2024   GLUCOSEU NEGATIVE 07/15/2017 2024   HGBUR SMALL (A) 07/15/2017 2024   BILIRUBINUR NEGATIVE 07/15/2017 2024   KETONESUR NEGATIVE 07/15/2017 2024   PROTEINUR NEGATIVE 07/15/2017 2024   NITRITE NEGATIVE 07/15/2017 2024   LEUKOCYTESUR NEGATIVE 07/15/2017 2024   Sepsis Labs: @LABRCNTIP (procalcitonin:4,lacticidven:4)   Recent Results (from the past 240 hour(s))  MRSA PCR Screening     Status: None   Collection Time: 07/16/17  1:54 AM  Result Value Ref Range Status   MRSA by PCR NEGATIVE NEGATIVE Final      Radiology Studies: Mr [name] Wo Contrast  Result Date: 07/15/2017 CLINICAL DATA:  31 year old female with intractable headache, left facial numbness. Possible migraine. EXAM: MRI HEAD WITHOUT AND WITH CONTRAST TECHNIQUE: Multiplanar, multiecho pulse sequences of the brain and surrounding structures were obtained without and with intravenous contrast. CONTRAST:  58mL MULTIHANCE GADOBENATE DIMEGLUMINE 529 MG/ML IV SOLN COMPARISON:  None. FINDINGS: Brain: There are fairly numerous foci of abnormal cerebral white matter T2 and FLAIR hyperintensity. Multiple periventricular lesions are demonstrated, and several of these have a perpendicular orientation to the ventricles (Dawson fingers series 10, image 99). There is marginal involvement of the body  of the left corpus callosum. There are scattered subcortical white matter lesions, including in both temporal lobes. The largest lesion is a nearly 2 cm T2 and FLAIR hyperintense area in the left cerebellar peduncle (series 6, image 10). Most of the lesions demonstrate increased trace diffusion signal, but only a right occipital periventricular white matter lesion appears restricted on diffusion (series 350, image 30). However, following contrast administration multiple of the lesions are seen to enhance. The left cerebellar peduncle lesion demonstrates an enhancing area of 13 mm. At least 7 other subcentimeter enhancing white matter lesions are scattered in both hemispheres (series 12 and series 13. Despite the size and enhancement of the  above lesions, there is no associated mass effect, and no surrounding cerebral edema. No definite cortical involvement is identified. There is no involvement of the deep gray matter nuclei. The brainstem and cerebellum appear spared except for the left cerebellar peduncle. The cervicomedullary junction and visible cervical spine appear grossly normal. No vascular territory areas of restricted diffusion to suggest acute infarction. No midline shift, ventriculomegaly, extra-axial collection or acute intracranial hemorrhage. Cervicomedullary junction and pituitary are within normal limits. No chronic cerebral blood products or mineralization. Vascular: Major intracranial vascular flow voids are preserved. Skull and upper cervical spine: Grossly negative visualized cervical spine. Visualized bone marrow signal is within normal limits. Sinuses/Orbits: No optic nerve or orbit signal abnormality is evident. Paranasal sinuses are clear. Other: Mastoid air cells are clear. Visible internal auditory structures appear normal. Negative scalp and face soft tissues. IMPRESSION: 1. Fairly numerous T2 and FLAIR lesions in the cerebral white matter, and nearly 2 cm lesion of the left cerebellar  peduncle. The constellation of findings is is strongly suggestive of Multiple Sclerosis. 2. At least 8 lesions, including that in the left cerebellar peduncle, are enhancing indicative of active demyelination. 3. Preliminary report of this study was discussed by telephone with Dr. Nilda Simmer, who is covering for Dr. Edwina Barth , at 1555 hours today. Electronically Signed   By: Odessa Fleming M.D.   On: 07/15/2017 16:24        Scheduled Meds: . enoxaparin (LOVENOX) injection  40 mg Subcutaneous Q24H  . levonorgestrel-ethinyl estradiol  1 tablet Oral Daily  . pantoprazole  40 mg Oral Q1200  . prochlorperazine  10 mg Intravenous Q6H  . sodium chloride flush  3 mL Intravenous Q12H   Continuous Infusions: . methylPREDNISolone (SOLU-MEDROL) injection Stopped (07/17/17 0027)     LOS: 1 day    Time spent: 25 minutes  Greater than 50% of the time spent on counseling and coordinating the care.   Manson Passey, MD [facility] Pager [phone]  If 7PM-7AM, please contact night-coverage [email or web address] Password [facility] 07/17/2017, 9:12 AM

## 2017-07-17 NOTE — Progress Notes (Signed)
Patient arrived in unit via wheelchair. Vital signs are stable.alert oriented x 4.skin assessment done with another nurse found small ecchymosis on left thigh. Patient given instruction about call bell  phone and unit routine. Bed is in low position and side rail up x 2.

## 2017-07-17 NOTE — Progress Notes (Signed)
Patient is currently on men tral cycle and normally don't take her birth contol medication placebo.

## 2017-07-18 ENCOUNTER — Encounter (HOSPITAL_COMMUNITY): Payer: Self-pay | Admitting: Student

## 2017-07-18 ENCOUNTER — Inpatient Hospital Stay (HOSPITAL_COMMUNITY): Payer: BC Managed Care – PPO

## 2017-07-18 DIAGNOSIS — I340 Nonrheumatic mitral (valve) insufficiency: Secondary | ICD-10-CM

## 2017-07-18 LAB — BASIC METABOLIC PANEL
ANION GAP: 8 (ref 5–15)
BUN: 14 mg/dL (ref 6–20)
CO2: 24 mmol/L (ref 22–32)
Calcium: 9.1 mg/dL (ref 8.9–10.3)
Chloride: 108 mmol/L (ref 101–111)
Creatinine, Ser: 0.87 mg/dL (ref 0.44–1.00)
GFR calc non Af Amer: >60 mL/min (ref >60)
GLUCOSE: 118 mg/dL — AB (ref 65–99)
POTASSIUM: 4.1 mmol/L (ref 3.5–5.1)
Sodium: 140 mmol/L (ref 135–145)

## 2017-07-18 LAB — ECHOCARDIOGRAM COMPLETE
HEIGHTINCHES: 65 in
Weight: 2272 oz

## 2017-07-18 LAB — CBC
HEMATOCRIT: 34.9 % — AB (ref 36.0–46.0)
Hemoglobin: 11.2 g/dL — ABNORMAL LOW (ref 12.0–15.0)
MCH: 26.7 pg (ref 26.0–34.0)
MCHC: 32.1 g/dL (ref 30.0–36.0)
MCV: 83.3 fL (ref 78.0–100.0)
PLATELETS: 250 10*3/uL (ref 150–400)
RBC: 4.19 MIL/uL (ref 3.87–5.11)
RDW: 13.7 % (ref 11.5–15.5)
WBC: 16.9 10*3/uL — AB (ref 4.0–10.5)

## 2017-07-18 LAB — GLUCOSE, CAPILLARY: Glucose-Capillary: 143 mg/dL — ABNORMAL HIGH (ref 65–99)

## 2017-07-18 MED ORDER — METHYLPREDNISOLONE SODIUM SUCC 1000 MG IJ SOLR
1000.0000 mg | Freq: Once | INTRAMUSCULAR | Status: AC
  Administered 2017-07-19: 1000 mg via INTRAVENOUS
  Filled 2017-07-18: qty 8

## 2017-07-18 MED ORDER — ZOLPIDEM TARTRATE 5 MG PO TABS
5.0000 mg | ORAL_TABLET | Freq: Every evening | ORAL | Status: DC | PRN
  Administered 2017-07-18: 5 mg via ORAL
  Filled 2017-07-18: qty 1

## 2017-07-18 MED ORDER — GABAPENTIN 600 MG PO TABS
300.0000 mg | ORAL_TABLET | Freq: Every day | ORAL | Status: DC
  Administered 2017-07-18: 300 mg via ORAL
  Filled 2017-07-18: qty 1

## 2017-07-18 MED ORDER — DIPHENHYDRAMINE HCL 25 MG PO CAPS: 25.0000 mg | ORAL_CAPSULE | Freq: Every evening | ORAL | Status: DC | PRN

## 2017-07-18 MED ORDER — SODIUM CHLORIDE 0.9 % IV SOLN
1000.0000 mg | Freq: Once | INTRAVENOUS | Status: AC
  Administered 2017-07-18: 1000 mg via INTRAVENOUS
  Filled 2017-07-18: qty 8

## 2017-07-18 NOTE — Progress Notes (Signed)
Patient ID: Michele Lane, female   DOB: Mar 18, 1986, 31 y.o.   MRN: [id number]  PROGRESS NOTE    Tamre Cass  WGN:[id number] DOB: [birth year] DOA: 07/15/2017  PCP: Georgina Quint, MD   Brief Narrative:  31 year old female, no significant past medical history, takes OCP and as needed ibuprofen. She started to have left facial numbness, headaches, dizziness and gait instability for past 2 weeks PTA. She had MRI brain on admission which was concerning for active demyelination due to MS. She was seen by neuro and started on IV solumedrol 1 gm daily for 5 days.    Assessment & Plan:   Active Problems: Headache / Facial numbness / Multiple sclerosis exacerbation (HCC) - MRI reviewed independently and with the pt and her family at the bedside. MRI showed fairly numerous lesions in the cerebral white matter, and nearly 2 cm lesion of the left cerebellar peduncle. The constellation of findings is strongly suggestive of Multiple Sclerosis. There are at least 8 lesions, including that in the left cerebellar peduncle,  indicative of active demyelination.  - Continue solumedrol for total of 5 days, today is day #4 - PT eval pending   Bradycardia - Asymptomatic    DVT prophylaxis: Lovenox subQ Code Status: full code  Family Communication: no family at the bedside Disposition Plan: home in am    Consultants:   Neurology  PT   Procedures:   None   Antimicrobials:   None    Subjective: No overnight events.  Objective: Vitals:   07/17/17 0605 07/17/17 1610 07/17/17 2121 07/18/17 0501  BP: (!) 96/43 (!) 107/52 (!) 99/52 102/61  Pulse:   69 70  Resp:   16 14  Temp:  98 F (36.7 C) 98.3 F (36.8 C) 98.6 F (37 C)  TempSrc:  Oral Oral   SpO2:  100% 97% 98%  Weight:      Height:       No intake or output data in the 24 hours ending 07/18/17 0901 Filed Weights   07/15/17 2023 07/16/17 0200 07/16/17 2105  Weight: 62.6 kg (138 lb) 62.8 kg (138 lb 7.2 oz) 64.4 kg (142 lb)    Physical Exam  Constitutional: Appears well-developed and well-nourished. No distress.  CVS: RRR, S1/S2 + Pulmonary: Effort and breath sounds normal, no stridor, rhonchi, wheezes, rales.  Abdominal: Soft. BS +,  no distension, tenderness, rebound or guarding.  Musculoskeletal: Normal range of motion. No edema and no tenderness.  Lymphadenopathy: No lymphadenopathy noted, cervical, inguinal. Neuro: Alert. Normal reflexes Skin: Skin is warm and dry. No rash noted. Not diaphoretic. No erythema. No pallor.  Psychiatric: Normal mood and affect. Behavior, judgment, thought content normal.     Data Reviewed: I have personally reviewed following labs and imaging studies  CBC:  Recent Labs Lab 07/15/17 2028 07/16/17 0221 07/18/17 0143  WBC 8.5 8.6 16.9*  NEUTROABS 4.8  --   --   HGB 13.1 12.3 11.2*  HCT 39.9 37.0 34.9*  MCV 83.8 83.5 83.3  PLT 296 280 250   Basic Metabolic Panel:  Recent Labs Lab 07/15/17 2028 07/16/17 0221 07/18/17 0143  NA 137  --  140  K 4.2  --  4.1  CL 104  --  108  CO2 26  --  24  GLUCOSE 99  --  118*  BUN 9  --  14  CREATININE 0.84 0.80 0.87  CALCIUM 9.7  --  9.1   GFR: Estimated Creatinine Clearance: 82.8 mL/min (by C-G  formula based on SCr of 0.87 mg/dL). Liver Function Tests:  Recent Labs Lab 07/15/17 2028  AST 25  ALT 25  ALKPHOS 53  BILITOT 0.4  PROT 7.2  ALBUMIN 4.2   No results for input(s): LIPASE, AMYLASE in the last 168 hours. No results for input(s): AMMONIA in the last 168 hours. Coagulation Profile: No results for input(s): INR, PROTIME in the last 168 hours. Cardiac Enzymes: No results for input(s): CKTOTAL, CKMB, CKMBINDEX, TROPONINI in the last 168 hours. BNP (last 3 results) No results for input(s): PROBNP in the last 8760 hours. HbA1C: No results for input(s): HGBA1C in the last 72 hours. CBG:  Recent Labs Lab 07/16/17 0534 07/17/17 0442 07/17/17 0744 07/18/17 0500  GLUCAP 119* 136* 152* 143*   Lipid  Profile: No results for input(s): CHOL, HDL, LDLCALC, TRIG, CHOLHDL, LDLDIRECT in the last 72 hours. Thyroid Function Tests:  Recent Labs  07/16/17 0221  TSH 2.218   Anemia Panel: No results for input(s): VITAMINB12, FOLATE, FERRITIN, TIBC, IRON, RETICCTPCT in the last 72 hours. Urine analysis:    Component Value Date/Time   COLORURINE COLORLESS (A) 07/15/2017 2024   APPEARANCEUR CLEAR 07/15/2017 2024   LABSPEC 1.002 (L) 07/15/2017 2024   PHURINE 6.0 07/15/2017 2024   GLUCOSEU NEGATIVE 07/15/2017 2024   HGBUR SMALL (A) 07/15/2017 2024   BILIRUBINUR NEGATIVE 07/15/2017 2024   KETONESUR NEGATIVE 07/15/2017 2024   PROTEINUR NEGATIVE 07/15/2017 2024   NITRITE NEGATIVE 07/15/2017 2024   LEUKOCYTESUR NEGATIVE 07/15/2017 2024   Sepsis Labs: @LABRCNTIP (procalcitonin:4,lacticidven:4)   Recent Results (from the past 240 hour(s))  MRSA PCR Screening     Status: None   Collection Time: 07/16/17  1:54 AM  Result Value Ref Range Status   MRSA by PCR NEGATIVE NEGATIVE Final      Radiology Studies: Mr [name] Wo Contrast  Result Date: 07/15/2017 CLINICAL DATA:  31 year old female with intractable headache, left facial numbness. Possible migraine. EXAM: MRI HEAD WITHOUT AND WITH CONTRAST TECHNIQUE: Multiplanar, multiecho pulse sequences of the brain and surrounding structures were obtained without and with intravenous contrast. CONTRAST:  15mL MULTIHANCE GADOBENATE DIMEGLUMINE 529 MG/ML IV SOLN COMPARISON:  None. FINDINGS: Brain: There are fairly numerous foci of abnormal cerebral white matter T2 and FLAIR hyperintensity. Multiple periventricular lesions are demonstrated, and several of these have a perpendicular orientation to the ventricles (Dawson fingers series 10, image 99). There is marginal involvement of the body of the left corpus callosum. There are scattered subcortical white matter lesions, including in both temporal lobes. The largest lesion is a nearly 2 cm T2 and FLAIR  hyperintense area in the left cerebellar peduncle (series 6, image 10). Most of the lesions demonstrate increased trace diffusion signal, but only a right occipital periventricular white matter lesion appears restricted on diffusion (series 350, image 30). However, following contrast administration multiple of the lesions are seen to enhance. The left cerebellar peduncle lesion demonstrates an enhancing area of 13 mm. At least 7 other subcentimeter enhancing white matter lesions are scattered in both hemispheres (series 12 and series 13. Despite the size and enhancement of the above lesions, there is no associated mass effect, and no surrounding cerebral edema. No definite cortical involvement is identified. There is no involvement of the deep gray matter nuclei. The brainstem and cerebellum appear spared except for the left cerebellar peduncle. The cervicomedullary junction and visible cervical spine appear grossly normal. No vascular territory areas of restricted diffusion to suggest acute infarction. No midline shift, ventriculomegaly, extra-axial collection or  acute intracranial hemorrhage. Cervicomedullary junction and pituitary are within normal limits. No chronic cerebral blood products or mineralization. Vascular: Major intracranial vascular flow voids are preserved. Skull and upper cervical spine: Grossly negative visualized cervical spine. Visualized bone marrow signal is within normal limits. Sinuses/Orbits: No optic nerve or orbit signal abnormality is evident. Paranasal sinuses are clear. Other: Mastoid air cells are clear. Visible internal auditory structures appear normal. Negative scalp and face soft tissues. IMPRESSION: 1. Fairly numerous T2 and FLAIR lesions in the cerebral white matter, and nearly 2 cm lesion of the left cerebellar peduncle. The constellation of findings is is strongly suggestive of Multiple Sclerosis. 2. At least 8 lesions, including that in the left cerebellar peduncle, are  enhancing indicative of active demyelination. 3. Preliminary report of this study was discussed by telephone with Dr. Nilda Simmer, who is covering for Dr. Edwina Barth , at 1555 hours today. Electronically Signed   By: Odessa Fleming M.D.   On: 07/15/2017 16:24   Mr Cervical Spine W Wo Contrast  Result Date: 07/17/2017 CLINICAL DATA:  New development of left facial numbness, headache and dizziness over the last 2 weeks. Brain examination showed a multiple T2 lesions suggesting multiple sclerosis. EXAM: MRI CERVICAL SPINE WITHOUT AND WITH CONTRAST TECHNIQUE: Multiplanar and multiecho pulse sequences of the cervical spine, to include the craniocervical junction and cervicothoracic junction, were obtained without and with intravenous contrast. CONTRAST:  15 cc MultiHance COMPARISON:  None. FINDINGS: Alignment: Straightening of the cervical lordosis in the midcervical region. Vertebrae: Degenerative spondylosis C5-6 as described below. Cord: Single focus of abnormal T2 signal within the right side of the cord at the C3-4 level, consistent with demyelinating disease. No other focal involvement. Posterior Fossa, vertebral arteries, paraspinal tissues: See results of previous brain exam. Disc levels: No abnormality at the foramen magnum, C1-2, C2-3 or C3-4. C4-5:  Minimal disc bulge.  No stenosis or neural compression. C5-6: Right paracentral disc herniation and spondylosis, effacing the ventral subarachnoid space and indenting the right side of the cord slightly. No foraminal extension. C6-7:  Normal interspace. C7-T1:  Normal interspace. IMPRESSION: Small focus of abnormal T2 signal within the right side of the cord at the C3-4 level consistent with demyelinating involvement. Right posterolateral disc herniation at C5-6, effacing the ventral subarachnoid space and indenting the right side of the cord. No foraminal extension. Electronically Signed   By: Paulina Fusi M.D.   On: 07/17/2017 15:21   Mr Thoracic Spine W Wo  Contrast  Result Date: 07/17/2017 CLINICAL DATA:  Multiple brain lesions consistent with multiple sclerosis. EXAM: MRI THORACIC WITHOUT AND WITH CONTRAST TECHNIQUE: Multiplanar and multiecho pulse sequences of the thoracic spine were obtained without and with intravenous contrast. CONTRAST:  15 cc MultiHance COMPARISON:  None. FINDINGS: MRI THORACIC SPINE FINDINGS Alignment:  Normal Vertebrae: Normal Cord: Normal. No T2 lesions in the thoracic cord. No abnormal enhancement. Paraspinal and other soft tissues: Normal Disc levels:  Normal IMPRESSION: Normal examination of the thoracic spine. No cord lesion. No degenerative change or stenosis. Electronically Signed   By: Paulina Fusi M.D.   On: 07/17/2017 15:23        Scheduled Meds: . enoxaparin (LOVENOX) injection  40 mg Subcutaneous Q24H  . levonorgestrel-ethinyl estradiol  1 tablet Oral Daily  . pantoprazole  40 mg Oral Q1200  . sodium chloride flush  3 mL Intravenous Q12H   Continuous Infusions: . methylPREDNISolone (SOLU-MEDROL) injection Stopped (07/18/17 0112)     LOS: 2 days  Time spent: 15 minutes  Greater than 50% of the time spent on counseling and coordinating the care.   Manson Passey, MD [facility] Pager [phone]  If 7PM-7AM, please contact night-coverage [email or web address] Password [email or web address] 07/18/2017, 9:01 AM

## 2017-07-18 NOTE — Progress Notes (Signed)
  Echocardiogram 2D Echocardiogram has been performed.  Tye Savoy 07/18/2017, 3:44 PM

## 2017-07-18 NOTE — Progress Notes (Signed)
Subjective: Feeling improved.    Exam: Vitals:   07/17/17 2121 07/18/17 0501  BP: (!) 99/52 102/61  Pulse: 69 70  Resp: 16 14  Temp: 98.3 F (36.8 C) 98.6 F (37 C)    HEENT-  Normocephalic, no lesions, without obvious abnormality.  Normal external eye and conjunctiva.  Normal TM's bilaterally.  Normal auditory canals and external ears. Normal external nose, mucus membranes and septum.  Normal pharynx.    Neuro:  CN: Pupils are equal and round. They are symmetrically reactive from 3-->2 mm. EOMI without nystagmus. Facial sensation is intact to light touch. Face is symmetric at rest with normal strength and mobility. Hearing is intact to conversational voice. Palate elevates symmetrically and uvula is midline. Voice is normal in tone, pitch and quality. Bilateral SCM and trapezii are 5/5. Tongue is midline with normal bulk and mobility.  Motor: Normal bulk, tone, and strength. 5/5 throughout. No drift.  Sensation: Intact to light touch.  DTRs: 2+, symmetric  Toes downgoing bilaterally. No pathologic reflexes.  Coordination: Finger-to-nose and heel-to-shin are without dysmetria     Pertinent Labs/Diagnostics: none    31 yo F with first clinical episode and imaging consistent with multiple sclerosis. Given the presence of both enhancing and non-enhancing lesions, and distribution in space, I feel that MS is by far the most likely diagnosis. With negative ESR, ANA I would favor treating as MS at this time. MRI cervical spine with a single lesion which is nonenhancing, keeping with the diagnosis of multiple sclerosis.  Recommendations: 1) Solumedrol 1g Today at 1000 and tomorrow will be last dose at 0900.  Follow up with Dr. Felecia Shelling in 2 weeks.  2) Compazine PRN for headache  Etta Quill PA-C [facility] [phone]   Neurology will S/O at this time.   07/18/2017, 9:47 AM

## 2017-07-18 NOTE — Consult Note (Signed)
Cardiology Consult    Patient ID: Michele Lane MRN: [id number], DOB/AGE: 33/30/1987   Admit date: 07/15/2017 Date of Consult: 07/18/2017  Primary Physician: Georgina Quint, MD Reason for Consult: Bradycardia Primary Cardiologist: New to [facility] - Dr. Tenny Craw Requesting Provider: Dr. Elisabeth Pigeon  Patient Profile    Michele Lane is a 31 y.o. female with past medical history of migraine headaches and recently diagnosed MS who is being seen today for the evaluation of bradycardia at the request of Dr. Elisabeth Pigeon.   History of Present Illness    She presented to [facility] ED on 07/15/2017 for evaluation of headaches, facial numbness, nausea, and vomiting. She had been evaluated by her PCP for the symptoms and an MRI was performed on 7/20 which showed numerous T2 and FLAIR lesions in the cerebral white matter, and nearly 2 cm lesion of the left cerebellar peduncle, suggestive of Multiple Sclerosis with at least 8 lesions, including that in the left cerebellar peduncle indicative of active demyelination. Her PCP reviewed the results with Neurology who recommended she be admitted for IV steroids and further workup of her MS.   Neurology was consulted and she was started on IV Solu-medrol for 5 days with plans for an outpatient LP.   At the time of admission, the H&P mentions she was bradycardic with HR in the 20's - 30's on telemetry at that time. Had a bad HA at that time  No nausea   and the episode only lasted for 1 minute before returning to NSR in the 60's - 70's. An EKG was obtai ned and showed NSR, HR 71, with ventricular trigeminy.   In reviewing telemetry, she has experienced episodes of ventricular bigeminy and trigeminy throughout admission. Over the past 24 hours, HR has dipped into the low-40's, mostly overnight, then back into the 60's - 70's during the day. Electrolytes have been within normal limits. TSH at 2.218.  The patient reports she has been asymptomatic with her episodes of  bradycardia and PVC's. She denies any recent chest pain, palpitations, dyspnea, dizziness, lightheadedness, or presyncope. She did have two syncopal episodes in her 20's which she says was secondary to documented hypoglycemia. No known family history of MS. Her grandfather had stent placed, in his 9's. No tobacco use or recreational drug use. Consumes 1-2 glasses of wine her week Prior to this recent illness she was very active, playing tennis regularly in leagues   The pt was in a local ER about 1 wks ago for N/,   HA    Family says her heart rate was slow at that time  ENT commented on it  Skips also noted.      Past Medical History   Past Medical History:  Diagnosis Date  . Hypoglycemia   . Migraine     History reviewed. No pertinent surgical history.   Allergies No Known Allergies  Inpatient Medications    . enoxaparin (LOVENOX) injection  40 mg Subcutaneous Q24H  . levonorgestrel-ethinyl estradiol  1 tablet Oral Daily  . pantoprazole  40 mg Oral Q1200  . sodium chloride flush  3 mL Intravenous Q12H    Family History    Family History  Problem Relation Age of Onset  . Neuropathy Mother   . Kidney cancer Father   . Diabetes Maternal Grandfather   . Heart block Paternal Grandfather        Required Pacemaker placement in his 15's    Social History    Social History  Social History  . Marital status: Single    Spouse name: N/A  . Number of children: N/A  . Years of education: N/A   Occupational History  . Not on file.   Social History Main Topics  . Smoking status: Never Smoker  . Smokeless tobacco: Never Used  . Alcohol use 0.6 oz/week    1 Glasses of wine per week  . Drug use: No  . Sexual activity: Yes    Birth control/ protection: Pill   Other Topics Concern  . Not on file   Social History Narrative   [employer] - class scheduler     Review of Systems    General:  No chills, fever, night sweats or weight changes.  Cardiovascular:  No chest pain,  dyspnea on exertion, edema, orthopnea, palpitations, paroxysmal nocturnal dyspnea. Dermatological: No rash, lesions/masses Respiratory: No cough, dyspnea Urologic: No hematuria, dysuria Abdominal:   No nausea, vomiting, diarrhea, bright red blood per rectum, melena, or hematemesis Neurologic:  No visual changes, wkns, changes in mental status. Positive for headaches and facial numbness.   All other systems reviewed and are otherwise negative except as noted above.  Physical Exam    Blood pressure 102/61, pulse 70, temperature 98.6 F (37 C), resp. rate 14, height 5\' 5"  (1.651 m), weight 142 lb (64.4 kg), last menstrual period 07/14/2017, SpO2 98 %.  General: Pleasant Caucasian female appearing in NAD Psych: Normal affect. Neuro: Alert and oriented X 3. Moves all extremities spontaneously. HEENT: Normal  Neck: Supple without bruits or JVD. Lungs:  Resp regular and unlabored, CTA without wheezing or rales. Heart: RRR with frequent ectopic beats, no s3, s4, or murmurs. Abdomen: Soft, non-tender, non-distended, BS + x 4.  Extremities: No clubbing, cyanosis or edema. DP/PT/Radials 2+ and equal bilaterally.  Labs    Troponin (Point of Care Test) No results for input(s): TROPIPOC in the last 72 hours. No results for input(s): CKTOTAL, CKMB, TROPONINI in the last 72 hours. Lab Results  Component Value Date   WBC 16.9 (H) 07/18/2017   HGB 11.2 (L) 07/18/2017   HCT 34.9 (L) 07/18/2017   MCV 83.3 07/18/2017   PLT 250 07/18/2017    Recent Labs Lab 07/15/17 2028  07/18/17 0143  NA 137  --  140  K 4.2  --  4.1  CL 104  --  108  CO2 26  --  24  BUN 9  --  14  CREATININE 0.84  < > 0.87  CALCIUM 9.7  --  9.1  PROT 7.2  --   --   BILITOT 0.4  --   --   ALKPHOS 53  --   --   ALT 25  --   --   AST 25  --   --   GLUCOSE 99  --  118*  < > = values in this interval not displayed. No results found for: CHOL, HDL, LDLCALC, TRIG No results found for: [facility]   Radiology Studies    Mr  [name] Wo Contrast  Result Date: 07/15/2017 CLINICAL DATA:  31 year old female with intractable headache, left facial numbness. Possible migraine. EXAM: MRI HEAD WITHOUT AND WITH CONTRAST TECHNIQUE: Multiplanar, multiecho pulse sequences of the brain and surrounding structures were obtained without and with intravenous contrast. CONTRAST:  15mL MULTIHANCE GADOBENATE DIMEGLUMINE 529 MG/ML IV SOLN COMPARISON:  None. FINDINGS: Brain: There are fairly numerous foci of abnormal cerebral white matter T2 and FLAIR hyperintensity. Multiple periventricular lesions are demonstrated, and several of these  have a perpendicular orientation to the ventricles (Dawson fingers series 10, image 99). There is marginal involvement of the body of the left corpus callosum. There are scattered subcortical white matter lesions, including in both temporal lobes. The largest lesion is a nearly 2 cm T2 and FLAIR hyperintense area in the left cerebellar peduncle (series 6, image 10). Most of the lesions demonstrate increased trace diffusion signal, but only a right occipital periventricular white matter lesion appears restricted on diffusion (series 350, image 30). However, following contrast administration multiple of the lesions are seen to enhance. The left cerebellar peduncle lesion demonstrates an enhancing area of 13 mm. At least 7 other subcentimeter enhancing white matter lesions are scattered in both hemispheres (series 12 and series 13. Despite the size and enhancement of the above lesions, there is no associated mass effect, and no surrounding cerebral edema. No definite cortical involvement is identified. There is no involvement of the deep gray matter nuclei. The brainstem and cerebellum appear spared except for the left cerebellar peduncle. The cervicomedullary junction and visible cervical spine appear grossly normal. No vascular territory areas of restricted diffusion to suggest acute infarction. No midline shift,  ventriculomegaly, extra-axial collection or acute intracranial hemorrhage. Cervicomedullary junction and pituitary are within normal limits. No chronic cerebral blood products or mineralization. Vascular: Major intracranial vascular flow voids are preserved. Skull and upper cervical spine: Grossly negative visualized cervical spine. Visualized bone marrow signal is within normal limits. Sinuses/Orbits: No optic nerve or orbit signal abnormality is evident. Paranasal sinuses are clear. Other: Mastoid air cells are clear. Visible internal auditory structures appear normal. Negative scalp and face soft tissues. IMPRESSION: 1. Fairly numerous T2 and FLAIR lesions in the cerebral white matter, and nearly 2 cm lesion of the left cerebellar peduncle. The constellation of findings is is strongly suggestive of Multiple Sclerosis. 2. At least 8 lesions, including that in the left cerebellar peduncle, are enhancing indicative of active demyelination. 3. Preliminary report of this study was discussed by telephone with Dr. Nilda Simmer, who is covering for Dr. Edwina Barth , at 1555 hours today. Electronically Signed   By: Odessa Fleming M.D.   On: 07/15/2017 16:24   Mr Cervical Spine W Wo Contrast  Result Date: 07/17/2017 CLINICAL DATA:  New development of left facial numbness, headache and dizziness over the last 2 weeks. Brain examination showed a multiple T2 lesions suggesting multiple sclerosis. EXAM: MRI CERVICAL SPINE WITHOUT AND WITH CONTRAST TECHNIQUE: Multiplanar and multiecho pulse sequences of the cervical spine, to include the craniocervical junction and cervicothoracic junction, were obtained without and with intravenous contrast. CONTRAST:  15 cc MultiHance COMPARISON:  None. FINDINGS: Alignment: Straightening of the cervical lordosis in the midcervical region. Vertebrae: Degenerative spondylosis C5-6 as described below. Cord: Single focus of abnormal T2 signal within the right side of the cord at the C3-4 level,  consistent with demyelinating disease. No other focal involvement. Posterior Fossa, vertebral arteries, paraspinal tissues: See results of previous brain exam. Disc levels: No abnormality at the foramen magnum, C1-2, C2-3 or C3-4. C4-5:  Minimal disc bulge.  No stenosis or neural compression. C5-6: Right paracentral disc herniation and spondylosis, effacing the ventral subarachnoid space and indenting the right side of the cord slightly. No foraminal extension. C6-7:  Normal interspace. C7-T1:  Normal interspace. IMPRESSION: Small focus of abnormal T2 signal within the right side of the cord at the C3-4 level consistent with demyelinating involvement. Right posterolateral disc herniation at C5-6, effacing the ventral subarachnoid space and indenting  the right side of the cord. No foraminal extension. Electronically Signed   By: Paulina Fusi M.D.   On: 07/17/2017 15:21   Mr Thoracic Spine W Wo Contrast  Result Date: 07/17/2017 CLINICAL DATA:  Multiple brain lesions consistent with multiple sclerosis. EXAM: MRI THORACIC WITHOUT AND WITH CONTRAST TECHNIQUE: Multiplanar and multiecho pulse sequences of the thoracic spine were obtained without and with intravenous contrast. CONTRAST:  15 cc MultiHance COMPARISON:  None. FINDINGS: MRI THORACIC SPINE FINDINGS Alignment:  Normal Vertebrae: Normal Cord: Normal. No T2 lesions in the thoracic cord. No abnormal enhancement. Paraspinal and other soft tissues: Normal Disc levels:  Normal IMPRESSION: Normal examination of the thoracic spine. No cord lesion. No degenerative change or stenosis. Electronically Signed   By: Paulina Fusi M.D.   On: 07/17/2017 15:23    EKG & Cardiac Imaging    EKG: NSR, HR 71, with ventricular trigeminy QTc 545 msec   Personally Reviewed  Echocardiogram: None on File  Assessment & Plan    1. Bradycardia/ Frequent Ectopy - by review of notes, HR was in the 20's - 30's for 30 seconds to 1 minute at the time of admission (unable to locate  tele strips), with quick return to NSR in the 60's - 70's. EKG showed NSR, HR 71, with ventricular trigeminy.  Family reports that commments were made about slow HR and skips at outside ED about 1 wk ago   - review of telemetry shows she has mostly been in SR with HR in the 60's- 70's with frequent episodes of ventricular bigeminy and trigeminy. HR is in the low-40's at night.  - she is asymptomatic with both her episodes of bradycardia and her frequent PVC's. She denies any recent chest pain, palpitations, dyspnea, , lightheadedness, or presyncope.  - electrolytes and TSH are within normal limits.   Etiology for bradycardia unclear  She had severe HA time but no nausea or vomitnt  MRI shows enhancement of C3/4 region.  ? If related   There have been no repeat episodes   Patient was treated quickly with IV steroids (which have been relaated to bradycardia in MS patients) .    2. Multiple Sclerosis - recent MRI showed numerous T2 and FLAIR lesions in the cerebral white matter, and nearly 2 cm lesion of the left cerebellar peduncle, suggestive of Multiple Sclerosis with at least 8 lesions, including that in the left cerebellar peduncle indicative of active demyelination. - being followed by Neurology. On Day 4 of 5 IV Solumedrol.   Signed, Ellsworth Lennox, PA-C 07/18/2017, 2:10 PM Pager: [phone]   Pt seen amd examined  I have reviewed and amended note by B Strader above  Pt with new onset neurologic symptoms.  MRI  With findings consistent with MS  She is being treated with high dose steroids  Prior to current illness she was very active, playing tennis regularly  In outside ED HR noted to be low with skips  Fri in ED she had HA  (no N/V at time)  HR was transiently reported in 20s. S She has continued to have PVCs throughout the stay  She is now on IV steroids   EKG on admit with signif prolonged QT interval  Note that she may have gotten phenergan prior.     Etiology of above  findings not clear  ? If related to MS  Note MRI with abnormality in C3/4 region and also cerebellum     I am not convinced purely vagal  for bradycardia   I have reviewed echo  I think it shows mild hypokinesis of inferior/inferoseptal wall IT was read as normal  (low normal)  REpeat EKG QT has improved    Recomm: Continue telemetry   D/C drugs that prolong Qt EKG in AM   I will review / discuss with EP  Consider holter to document total PVC burden in 24 hours  .  Dietrich Pates

## 2017-07-18 NOTE — Evaluation (Signed)
Physical Therapy Evaluation Patient Details Name: Michele Lane MRN: [id number] DOB: [birth year] Today's Date: 07/18/2017   History of Present Illness  31 yo female with onset of L facial numbness and migraine, asymptomatic bradycardia and now noted R C5-6 cord indentation from spinal degen changes along with demyelination of left cerebellar peduncle, C3-4 level cord demyelination, and C5-6 spondylosis.  PMHx:  hypoglycemia, dizziness  Clinical Impression  Pt is up to walk with PT and note her minor instability compensated for by holding her ankles in a minimal stiff posture.   Her fall risk is low, has no need of AD just now but would benefit from an outpatient therapy session series to establish some reasonable exercises and monitor her symptoms, determine if her balance continues to be reasonable enough with strengthening to merit avoiding an AD for now.  Follow acutely as needed for same goals and safety education for her gait and esp to try stairs if possible.    Follow Up Recommendations Outpatient PT    Equipment Recommendations  None recommended by PT    Recommendations for Other Services       Precautions / Restrictions Precautions Precautions: Fall (telemetry) Restrictions Weight Bearing Restrictions: No      Mobility  Bed Mobility Overal bed mobility: Modified Independent                Transfers Overall transfer level: Modified independent Equipment used: None             General transfer comment: pt uses good hand placement for controlling transfers  Ambulation/Gait Ambulation/Gait assistance: Min guard Ambulation Distance (Feet): 280 Feet Assistive device: 1 person hand held assist (min guard for safety) Gait Pattern/deviations: Step-through pattern;Narrow base of support;Decreased stride length;Shuffle Gait velocity: reduced Gait velocity interpretation: Below normal speed for age/gender General Gait Details: tends to exaggerate shuffle per pt as a  safety measure  Stairs Stairs:  (deferred)          Wheelchair Mobility    Modified Rankin (Stroke Patients Only)       Balance Overall balance assessment: History of Falls                               Standardized Balance Assessment Standardized Balance Assessment : TUG: Timed Up and Go Test     Timed Up and Go Test TUG: Normal TUG Normal TUG (seconds): 12.56     Pertinent Vitals/Pain Pain Assessment: No/denies pain    Home Living Family/patient expects to be discharged to:: Private residence Living Arrangements: Spouse/significant other Available Help at Discharge: Family;Available PRN/intermittently Type of Home: House Home Access: Stairs to enter Entrance Stairs-Rails: None Entrance Stairs-Number of Steps: 2 Home Layout: One level Home Equipment: None      Prior Function Level of Independence: Independent               Hand Dominance        Extremity/Trunk Assessment   Upper Extremity Assessment Upper Extremity Assessment: Overall WFL for tasks assessed    Lower Extremity Assessment Lower Extremity Assessment: Generalized weakness    Cervical / Trunk Assessment Cervical / Trunk Assessment: Normal  Communication   Communication: No difficulties  Cognition Arousal/Alertness: Awake/alert Behavior During Therapy: WFL for tasks assessed/performed Overall Cognitive Status: Within Functional Limits for tasks assessed  General Comments General comments (skin integrity, edema, etc.): Pt has a normal range gait speed for regular TUG, did fall but was at night to BR    Exercises     Assessment/Plan    PT Assessment Patient needs continued PT services  PT Problem List Decreased range of motion;Decreased strength;Decreased activity tolerance;Decreased balance;Decreased mobility;Decreased coordination;Decreased safety awareness;Decreased knowledge of precautions       PT  Treatment Interventions Gait training;Functional mobility training;Therapeutic activities;Stair training;Therapeutic exercise;Balance training;Neuromuscular re-education;Patient/family education    PT Goals (Current goals can be found in the Care Plan section)  Acute Rehab PT Goals Patient Stated Goal: to feel stronger and get home PT Goal Formulation: With patient/family Time For Goal Achievement: 08/01/17 Potential to Achieve Goals: Good    Frequency Min 3X/week   Barriers to discharge Decreased caregiver support home on FMLA but husband is working    Co-evaluation               AM-PAC PT "6 Clicks" Daily Activity  Outcome Measure Difficulty turning over in bed (including adjusting bedclothes, sheets and blankets)?: None Difficulty moving from lying on back to sitting on the side of the bed? : None Difficulty sitting down on and standing up from a chair with arms (e.g., wheelchair, bedside commode, etc,.)?: None Help needed moving to and from a bed to chair (including a wheelchair)?: A Little Help needed walking in hospital room?: A Little Help needed climbing 3-5 steps with a railing? : A Little 6 Click Score: 21    End of Session Equipment Utilized During Treatment: Gait belt Activity Tolerance: Patient tolerated treatment well Patient left: in chair;with call bell/phone within reach;with family/visitor present Nurse Communication: Mobility status PT Visit Diagnosis: Other abnormalities of gait and mobility (R26.89);Muscle weakness (generalized) (M62.81);History of falling (Z91.81)    Time: 9147-8295 PT Time Calculation (min) (ACUTE ONLY): 19 min   Charges:   PT Evaluation $PT Eval Moderate Complexity: 1 Procedure     PT G Codes:   PT G-Codes **NOT FOR INPATIENT CLASS** Functional Assessment Tool Used: AM-PAC 6 Clicks Basic Mobility   Ivar Drape 07/18/2017, 12:54 PM   Samul Dada, PT [place] Acute Rehab Dept. Number: [facility] [id number] and [facility] [phone]

## 2017-07-19 LAB — CBC
HEMATOCRIT: 33.9 % — AB (ref 36.0–46.0)
Hemoglobin: 11.2 g/dL — ABNORMAL LOW (ref 12.0–15.0)
MCH: 27.6 pg (ref 26.0–34.0)
MCHC: 33 g/dL (ref 30.0–36.0)
MCV: 83.5 fL (ref 78.0–100.0)
PLATELETS: 242 10*3/uL (ref 150–400)
RBC: 4.06 MIL/uL (ref 3.87–5.11)
RDW: 13.9 % (ref 11.5–15.5)
WBC: 16.6 10*3/uL — AB (ref 4.0–10.5)

## 2017-07-19 LAB — GLUCOSE, CAPILLARY: Glucose-Capillary: 131 mg/dL — ABNORMAL HIGH (ref 65–99)

## 2017-07-19 MED ORDER — PANTOPRAZOLE SODIUM 40 MG PO TBEC: 40.0000 mg | DELAYED_RELEASE_TABLET | Freq: Every day | ORAL | 0 refills | Status: DC

## 2017-07-19 MED ORDER — GABAPENTIN 600 MG PO TABS: 300.0000 mg | ORAL_TABLET | Freq: Every day | ORAL | 0 refills | Status: DC

## 2017-07-19 NOTE — Progress Notes (Signed)
Progress Note  Patient Name: Michele Lane Date of Encounter: 07/19/2017  Primary Cardiologist: New    Subjective   No SOB   No dizziness  No palpiations    Inpatient Medications    Scheduled Meds: . enoxaparin (LOVENOX) injection  40 mg Subcutaneous Q24H  . gabapentin  300 mg Oral QHS  . pantoprazole  40 mg Oral Q1200  . sodium chloride flush  3 mL Intravenous Q12H   Continuous Infusions: . methylPREDNISolone (SOLU-MEDROL) injection     PRN Meds: butalbital-acetaminophen-caffeine, diazepam, ibuprofen, ondansetron, pneumococcal 23 valent vaccine, traMADol, zolpidem   Vital Signs    Vitals:   07/18/17 0501 07/18/17 1450 07/18/17 2111 07/19/17 0531  BP: 102/61 118/63 114/74 113/66  Pulse: 70 78 76 81  Resp: 14 14 18 18   Temp: 98.6 F (37 C) 98.2 F (36.8 C) 98.5 F (36.9 C) 98.4 F (36.9 C)  TempSrc:  Oral Oral Oral  SpO2: 98% 99% 98% 98%  Weight:      Height:        Intake/Output Summary (Last 24 hours) at 07/19/17 0757 Last data filed at 07/18/17 4193  Gross per 24 hour  Intake              240 ml  Output                0 ml  Net              240 ml   Filed Weights   07/15/17 2023 07/16/17 0200 07/16/17 2105  Weight: 138 lb (62.6 kg) 138 lb 7.2 oz (62.8 kg) 142 lb (64.4 kg)    Telemetry     SR with PVCs  - Personally Reviewed  ECG    SR   QTx 494    Physical Exam   GEN: No acute distress.   Neck: No JVD Cardiac: RRR, no murmurs, rubs, or gallops.  Respiratory: Clear to auscultation bilaterally. GI: Soft, nontender, non-distended  MS: No edema; No deformity. Neuro:  Nonfocal  Psych: Normal affect   Labs    Chemistry Recent Labs Lab 07/15/17 2028 07/16/17 0221 07/18/17 0143  NA 137  --  140  K 4.2  --  4.1  CL 104  --  108  CO2 26  --  24  GLUCOSE 99  --  118*  BUN 9  --  14  CREATININE 0.84 0.80 0.87  CALCIUM 9.7  --  9.1  PROT 7.2  --   --   ALBUMIN 4.2  --   --   AST 25  --   --   ALT 25  --   --   ALKPHOS 53  --   --     BILITOT 0.4  --   --   GFRNONAA >60 >60 >60  GFRAA >60 >60 >60  ANIONGAP 7  --  8     Hematology Recent Labs Lab 07/16/17 0221 07/18/17 0143 07/19/17 0308  WBC 8.6 16.9* 16.6*  RBC 4.43 4.19 4.06  HGB 12.3 11.2* 11.2*  HCT 37.0 34.9* 33.9*  MCV 83.5 83.3 83.5  MCH 27.8 26.7 27.6  MCHC 33.2 32.1 33.0  RDW 13.5 13.7 13.9  PLT 280 250 242    Cardiac EnzymesNo results for input(s): TROPONINI in the last 168 hours. No results for input(s): TROPIPOC in the last 168 hours.   BNPNo results for input(s): BNP, PROBNP in the last 168 hours.   DDimer No results for input(s): DDIMER in  the last 168 hours.   Radiology    Mr Cervical Spine W Wo Contrast  Result Date: 07/17/2017 CLINICAL DATA:  New development of left facial numbness, headache and dizziness over the last 2 weeks. Brain examination showed a multiple T2 lesions suggesting multiple sclerosis. EXAM: MRI CERVICAL SPINE WITHOUT AND WITH CONTRAST TECHNIQUE: Multiplanar and multiecho pulse sequences of the cervical spine, to include the craniocervical junction and cervicothoracic junction, were obtained without and with intravenous contrast. CONTRAST:  15 cc MultiHance COMPARISON:  None. FINDINGS: Alignment: Straightening of the cervical lordosis in the midcervical region. Vertebrae: Degenerative spondylosis C5-6 as described below. Cord: Single focus of abnormal T2 signal within the right side of the cord at the C3-4 level, consistent with demyelinating disease. No other focal involvement. Posterior Fossa, vertebral arteries, paraspinal tissues: See results of previous brain exam. Disc levels: No abnormality at the foramen magnum, C1-2, C2-3 or C3-4. C4-5:  Minimal disc bulge.  No stenosis or neural compression. C5-6: Right paracentral disc herniation and spondylosis, effacing the ventral subarachnoid space and indenting the right side of the cord slightly. No foraminal extension. C6-7:  Normal interspace. C7-T1:  Normal interspace.  IMPRESSION: Small focus of abnormal T2 signal within the right side of the cord at the C3-4 level consistent with demyelinating involvement. Right posterolateral disc herniation at C5-6, effacing the ventral subarachnoid space and indenting the right side of the cord. No foraminal extension. Electronically Signed   By: Paulina Fusi M.D.   On: 07/17/2017 15:21   Mr Thoracic Spine W Wo Contrast  Result Date: 07/17/2017 CLINICAL DATA:  Multiple brain lesions consistent with multiple sclerosis. EXAM: MRI THORACIC WITHOUT AND WITH CONTRAST TECHNIQUE: Multiplanar and multiecho pulse sequences of the thoracic spine were obtained without and with intravenous contrast. CONTRAST:  15 cc MultiHance COMPARISON:  None. FINDINGS: MRI THORACIC SPINE FINDINGS Alignment:  Normal Vertebrae: Normal Cord: Normal. No T2 lesions in the thoracic cord. No abnormal enhancement. Paraspinal and other soft tissues: Normal Disc levels:  Normal IMPRESSION: Normal examination of the thoracic spine. No cord lesion. No degenerative change or stenosis. Electronically Signed   By: Paulina Fusi M.D.   On: 07/17/2017 15:23    Cardiac Studies     Patient Profile       Assessment & Plan     1  Bradycardia  Pt has had no recurrent spells of profound bradycardia as reported from ED  Unfort no strips of this   HR hs been 40s and above   BP OK  She continues to have frequ PVCs    EKG QT remains prolonged though better   I have reviewed case with Odessa Fleming  He has agreed to follow her as an outpt  Will arrange for a 24 hour holter monitor to record PVC burden  May consider flecanide as an outpt My review of echo LVEF appears mildly depressed  Can be followed  2  Prolonged QT  Avoid drugs that prolong this  I have shown pt website. F/U with Odessa Fleming.       Signed, Dietrich Pates, MD  07/19/2017, 7:57 AM

## 2017-07-19 NOTE — Progress Notes (Signed)
Patient with frustration and pain In her legs. She stated restless legs and she takes benadryl at times for it. Call on call and gabapentin was ordered and given.

## 2017-07-19 NOTE — Care Management Note (Signed)
Case Management Note  Patient Details  Name: Michele Lane MRN: [id number] Date of Birth: 1986-06-15  Subjective/Objective:            Admitted with MS exacerbation.       PCP: Eilleen Kempf Sagardia  Action/Plan: Plan is to d/c to home today. Outpatient PT referral made per CM, office to call and arrange appointment  Time. CM made pt aware.  Expected Discharge Date:  07/19/17               Expected Discharge Plan:  Home/Self Care  In-House Referral:     Discharge planning Services  CM Consult  Post AcutStatus of Service:  Completed, signed off  If discussed at Long Length of Stay Meetings, dates discussed:    Additional Comments:  Epifanio Lesches, RN 07/19/2017, 3:19 PM

## 2017-07-19 NOTE — Progress Notes (Signed)
Physical Therapy Treatment Patient Details Name: Michele Lane MRN: [id number] DOB: 1986/01/31 Today's Date: 07/19/2017    History of Present Illness 31 yo female with onset of L facial numbness and migraine, asymptomatic bradycardia and now noted R C5-6 cord indentation from spinal degen changes along with demyelination of left cerebellar peduncle, C3-4 level cord demyelination, and C5-6 spondylosis.  PMHx:  hypoglycemia, dizziness    PT Comments    Pt demonstrates improved mobility this session with the ability to perform increased gait distance and stair negotiation without any increased difficulty noted this session. Pt continues to require supervision for mobility, but is approaching independent. Pt continues to benefit from outpatient therapy to set up a good exercise program given current diagnosis.     Follow Up Recommendations  Outpatient PT     Equipment Recommendations  None recommended by PT    Recommendations for Other Services       Precautions / Restrictions Precautions Precautions: None Restrictions Weight Bearing Restrictions: No    Mobility  Bed Mobility Overal bed mobility: Modified Independent                Transfers Overall transfer level: Modified independent Equipment used: None             General transfer comment: pt uses good hand placement for controlling transfers  Ambulation/Gait Ambulation/Gait assistance: Supervision Ambulation Distance (Feet): 300 Feet Assistive device: None Gait Pattern/deviations: Step-through pattern Gait velocity: decreased Gait velocity interpretation: Below normal speed for age/gender General Gait Details: Improved upright posture, sequencing, and ability to perform gait without LOB.    Stairs Stairs: Yes   Stair Management: One rail Right;Step to pattern;Forwards Number of Stairs: 10 General stair comments: pt is able to perform stairs with supervision.   Wheelchair Mobility    Modified  Rankin (Stroke Patients Only)       Balance Overall balance assessment: History of Falls                                          Cognition Arousal/Alertness: Awake/alert Behavior During Therapy: WFL for tasks assessed/performed Overall Cognitive Status: Within Functional Limits for tasks assessed                                        Exercises      General Comments        Pertinent Vitals/Pain Pain Assessment: No/denies pain    Home Living                      Prior Function            PT Goals (current goals can now be found in the care plan section) Acute Rehab PT Goals Patient Stated Goal: to feel stronger and get home Progress towards PT goals: Progressing toward goals    Frequency    Min 3X/week      PT Plan Current plan remains appropriate    Co-evaluation              AM-PAC PT "6 Clicks" Daily Activity  Outcome Measure  Difficulty turning over in bed (including adjusting bedclothes, sheets and blankets)?: None Difficulty moving from lying on back to sitting on the side of the bed? : None Difficulty sitting down on and standing  up from a chair with arms (e.g., wheelchair, bedside commode, etc,.)?: None Help needed moving to and from a bed to chair (including a wheelchair)?: None Help needed walking in hospital room?: None Help needed climbing 3-5 steps with a railing? : None 6 Click Score: 24    End of Session Equipment Utilized During Treatment: Gait belt Activity Tolerance: Patient tolerated treatment well Patient left: in bed;with call bell/phone within reach;with family/visitor present Nurse Communication: Mobility status PT Visit Diagnosis: Other abnormalities of gait and mobility (R26.89);Muscle weakness (generalized) (M62.81);History of falling (Z91.81)     Time: 1610-9604 PT Time Calculation (min) (ACUTE ONLY): 8 min  Charges:  $Gait Training: 8-22 mins                    G  Codes:       Colin Broach PT, DPT  [phone]    Ruel Favors Aletha Halim 07/19/2017, 1:03 PM

## 2017-07-19 NOTE — Discharge Summary (Signed)
Physician Discharge Summary  Michele Lane BJY:[id number] DOB: 1986/10/12 DOA: 07/15/2017  PCP: Michele Quint, MD  Admit date: 07/15/2017 Discharge date: 07/19/2017  Recommendations for Outpatient Follow-up:  1. Pt instructed to follow up with cardio and neurology per sch appt  Discharge Diagnoses:  Active Problems:   Atypical migraine   Multiple sclerosis exacerbation (HCC)   Bradycardia    Discharge Condition: stable   Diet recommendation: as tolerated   History of present illness:  31 year old female, no significant past medical history, takes OCP and as needed ibuprofen. She started to have left facial numbness, headaches, dizziness and gait instability for past 2 weeks PTA. She had MRI brain on admission which was concerning for active demyelination due to MS. She was seen by neuro and started on IV solumedrol 1 gm daily for 5 days.   Hospital Course:  Active Problems: Headache / Facial numbness / Multiple sclerosis exacerbation (HCC) - MRI reviewed independently and with the pt and her family at the bedside. MRI showed fairly numerous lesions in the cerebral white matter, and nearly 2 cm lesion of the left cerebellar peduncle. The constellation of findings is strongly suggestive of Multiple Sclerosis. There are at least 8 lesions, including that in the left cerebellar peduncle,  indicative of active demyelination.  - Completed solumedrol for 5 day 1 gm a day - Follow up with neuro on outpt basis - Steroids not required on discharge  Bradycardia / PVC's - Holter monitor outpt per cardio   DVT prophylaxis: Lovenox subQ Code Status: full code  Family Communication:  family at the bedside    Consultants:   Neurology  PT   Procedures:   None   Antimicrobials:   None    Signed:  Manson Passey, MD  [facility] 07/19/2017, 11:19 AM  Pager #: [phone]  Time spent in minutes: more than 30 minutes    Discharge Exam: Vitals:    07/18/17 2111 07/19/17 0531  BP: 114/74 113/66  Pulse: 76 81  Resp: 18 18  Temp: 98.5 F (36.9 C) 98.4 F (36.9 C)   Vitals:   07/18/17 0501 07/18/17 1450 07/18/17 2111 07/19/17 0531  BP: 102/61 118/63 114/74 113/66  Pulse: 70 78 76 81  Resp: 14 14 18 18   Temp: 98.6 F (37 C) 98.2 F (36.8 C) 98.5 F (36.9 C) 98.4 F (36.9 C)  TempSrc:  Oral Oral Oral  SpO2: 98% 99% 98% 98%  Weight:      Height:        General: Pt is alert, follows commands appropriately, not in acute distress Cardiovascular: Regular rate and rhythm, S1/S2 +, no murmurs Respiratory: Clear to auscultation bilaterally, no wheezing, no crackles, no rhonchi Abdominal: Soft, non tender, non distended, bowel sounds +, no guarding Extremities: no edema, no cyanosis, pulses palpable bilaterally DP and PT Neuro: Grossly nonfocal  Discharge Instructions  Discharge Instructions    Ambulatory referral to Neurology    Complete by:  As directed    An appointment is requested in approximately: 2 weeks   Ambulatory referral to Neurology    Complete by:  As directed    An appointment is requested in approximately: 2 weeks   Call MD for:  persistant nausea and vomiting    Complete by:  As directed    Call MD for:  redness, tenderness, or signs of infection (pain, swelling, redness, odor or green/yellow discharge around incision site)    Complete by:  As directed    Call  MD for:  severe uncontrolled pain    Complete by:  As directed    Diet - low sodium heart healthy    Complete by:  As directed    Discharge instructions    Complete by:  As directed    Follow up with cardio for Holter monitor. No need for steroids on discharge per neurology, follow up with neurology.   Increase activity slowly    Complete by:  As directed      Allergies as of 07/19/2017   No Known Allergies     Medication List    STOP taking these medications   ondansetron 4 MG tablet Commonly known as:  ZOFRAN     TAKE these  medications   butalbital-acetaminophen-caffeine 50-325-40 MG tablet Commonly known as:  ESGIC Take 1 tablet by mouth every 6 (six) hours as needed for headache.   diazepam 5 MG tablet Commonly known as:  VALIUM Take 1 tablet (5 mg total) by mouth 2 (two) times daily. As needed. What changed:  when to take this  reasons to take this  additional instructions   gabapentin 600 MG tablet Commonly known as:  NEURONTIN Take 0.5 tablets (300 mg total) by mouth at bedtime.   ibuprofen 200 MG tablet Commonly known as:  ADVIL,MOTRIN Take 200-400 mg by mouth every 6 (six) hours as needed for moderate pain.   levonorgestrel-ethinyl estradiol 0.15-0.03 MG tablet Commonly known as:  SEASONALE,INTROVALE,JOLESSA Take 1 tablet by mouth daily.   pantoprazole 40 MG tablet Commonly known as:  PROTONIX Take 1 tablet (40 mg total) by mouth daily at 12 noon.      Follow-up Information    Michele Quint, MD. Schedule an appointment as soon as possible for a visit in 1 week(s).   Specialty:  Internal Medicine Contact information: [address] [place] [address] [phone]            The results of significant diagnostics from this hospitalization (including imaging, microbiology, ancillary and laboratory) are listed below for reference.    Significant Diagnostic Studies: Mr [name] Wo Contrast  Result Date: 07/15/2017 CLINICAL DATA:  31 year old female with intractable headache, left facial numbness. Possible migraine. EXAM: MRI HEAD WITHOUT AND WITH CONTRAST TECHNIQUE: Multiplanar, multiecho pulse sequences of the brain and surrounding structures were obtained without and with intravenous contrast. CONTRAST:  72mL MULTIHANCE GADOBENATE DIMEGLUMINE 529 MG/ML IV SOLN COMPARISON:  None. FINDINGS: Brain: There are fairly numerous foci of abnormal cerebral white matter T2 and FLAIR hyperintensity. Multiple periventricular lesions are demonstrated, and several of these have a  perpendicular orientation to the ventricles (Dawson fingers series 10, image 99). There is marginal involvement of the body of the left corpus callosum. There are scattered subcortical white matter lesions, including in both temporal lobes. The largest lesion is a nearly 2 cm T2 and FLAIR hyperintense area in the left cerebellar peduncle (series 6, image 10). Most of the lesions demonstrate increased trace diffusion signal, but only a right occipital periventricular white matter lesion appears restricted on diffusion (series 350, image 30). However, following contrast administration multiple of the lesions are seen to enhance. The left cerebellar peduncle lesion demonstrates an enhancing area of 13 mm. At least 7 other subcentimeter enhancing white matter lesions are scattered in both hemispheres (series 12 and series 13. Despite the size and enhancement of the above lesions, there is no associated mass effect, and no surrounding cerebral edema. No definite cortical involvement is identified. There is no involvement of the  deep gray matter nuclei. The brainstem and cerebellum appear spared except for the left cerebellar peduncle. The cervicomedullary junction and visible cervical spine appear grossly normal. No vascular territory areas of restricted diffusion to suggest acute infarction. No midline shift, ventriculomegaly, extra-axial collection or acute intracranial hemorrhage. Cervicomedullary junction and pituitary are within normal limits. No chronic cerebral blood products or mineralization. Vascular: Major intracranial vascular flow voids are preserved. Skull and upper cervical spine: Grossly negative visualized cervical spine. Visualized bone marrow signal is within normal limits. Sinuses/Orbits: No optic nerve or orbit signal abnormality is evident. Paranasal sinuses are clear. Other: Mastoid air cells are clear. Visible internal auditory structures appear normal. Negative scalp and face soft tissues.  IMPRESSION: 1. Fairly numerous T2 and FLAIR lesions in the cerebral white matter, and nearly 2 cm lesion of the left cerebellar peduncle. The constellation of findings is is strongly suggestive of Multiple Sclerosis. 2. At least 8 lesions, including that in the left cerebellar peduncle, are enhancing indicative of active demyelination. 3. Preliminary report of this study was discussed by telephone with Dr. Nilda Simmer, who is covering for Dr. Edwina Barth , at 1555 hours today. Electronically Signed   By: Odessa Fleming M.D.   On: 07/15/2017 16:24   Mr Cervical Spine W Wo Contrast  Result Date: 07/17/2017 CLINICAL DATA:  New development of left facial numbness, headache and dizziness over the last 2 weeks. Brain examination showed a multiple T2 lesions suggesting multiple sclerosis. EXAM: MRI CERVICAL SPINE WITHOUT AND WITH CONTRAST TECHNIQUE: Multiplanar and multiecho pulse sequences of the cervical spine, to include the craniocervical junction and cervicothoracic junction, were obtained without and with intravenous contrast. CONTRAST:  15 cc MultiHance COMPARISON:  None. FINDINGS: Alignment: Straightening of the cervical lordosis in the midcervical region. Vertebrae: Degenerative spondylosis C5-6 as described below. Cord: Single focus of abnormal T2 signal within the right side of the cord at the C3-4 level, consistent with demyelinating disease. No other focal involvement. Posterior Fossa, vertebral arteries, paraspinal tissues: See results of previous brain exam. Disc levels: No abnormality at the foramen magnum, C1-2, C2-3 or C3-4. C4-5:  Minimal disc bulge.  No stenosis or neural compression. C5-6: Right paracentral disc herniation and spondylosis, effacing the ventral subarachnoid space and indenting the right side of the cord slightly. No foraminal extension. C6-7:  Normal interspace. C7-T1:  Normal interspace. IMPRESSION: Small focus of abnormal T2 signal within the right side of the cord at the C3-4 level  consistent with demyelinating involvement. Right posterolateral disc herniation at C5-6, effacing the ventral subarachnoid space and indenting the right side of the cord. No foraminal extension. Electronically Signed   By: Paulina Fusi M.D.   On: 07/17/2017 15:21   Mr Thoracic Spine W Wo Contrast  Result Date: 07/17/2017 CLINICAL DATA:  Multiple brain lesions consistent with multiple sclerosis. EXAM: MRI THORACIC WITHOUT AND WITH CONTRAST TECHNIQUE: Multiplanar and multiecho pulse sequences of the thoracic spine were obtained without and with intravenous contrast. CONTRAST:  15 cc MultiHance COMPARISON:  None. FINDINGS: MRI THORACIC SPINE FINDINGS Alignment:  Normal Vertebrae: Normal Cord: Normal. No T2 lesions in the thoracic cord. No abnormal enhancement. Paraspinal and other soft tissues: Normal Disc levels:  Normal IMPRESSION: Normal examination of the thoracic spine. No cord lesion. No degenerative change or stenosis. Electronically Signed   By: Paulina Fusi M.D.   On: 07/17/2017 15:23    Microbiology: Recent Results (from the past 240 hour(s))  MRSA PCR Screening     Status: None  Collection Time: 07/16/17  1:54 AM  Result Value Ref Range Status   MRSA by PCR NEGATIVE NEGATIVE Final    Comment:        The GeneXpert MRSA Assay (FDA approved for NASAL specimens only), is one component of a comprehensive MRSA colonization surveillance program. It is not intended to diagnose MRSA infection nor to guide or monitor treatment for MRSA infections.      Labs: Basic Metabolic Panel:  Recent Labs Lab 07/15/17 2028 07/16/17 0221 07/18/17 0143  NA 137  --  140  K 4.2  --  4.1  CL 104  --  108  CO2 26  --  24  GLUCOSE 99  --  118*  BUN 9  --  14  CREATININE 0.84 0.80 0.87  CALCIUM 9.7  --  9.1   Liver Function Tests:  Recent Labs Lab 07/15/17 2028  AST 25  ALT 25  ALKPHOS 53  BILITOT 0.4  PROT 7.2  ALBUMIN 4.2   No results for input(s): LIPASE, AMYLASE in the last 168  hours. No results for input(s): AMMONIA in the last 168 hours. CBC:  Recent Labs Lab 07/15/17 2028 07/16/17 0221 07/18/17 0143 07/19/17 0308  WBC 8.5 8.6 16.9* 16.6*  NEUTROABS 4.8  --   --   --   HGB 13.1 12.3 11.2* 11.2*  HCT 39.9 37.0 34.9* 33.9*  MCV 83.8 83.5 83.3 83.5  PLT 296 280 250 242   Cardiac Enzymes: No results for input(s): CKTOTAL, CKMB, CKMBINDEX, TROPONINI in the last 168 hours. BNP: BNP (last 3 results) No results for input(s): BNP in the last 8760 hours.  ProBNP (last 3 results) No results for input(s): PROBNP in the last 8760 hours.  CBG:  Recent Labs Lab 07/16/17 0534 07/17/17 0442 07/17/17 0744 07/18/17 0500 07/19/17 0750  GLUCAP 119* 136* 152* 143* 131*

## 2017-07-19 NOTE — Progress Notes (Signed)
Nsg Discharge Note  Admit Date:  07/15/2017 Discharge date: 07/19/2017   Rubye Oaks to be D/C'd Home per MD order.  AVS completed.  Copy for chart, and copy for patient signed, and dated. Patient/caregiver able to verbalize understanding.  Discharge Medication: Allergies as of 07/19/2017   No Known Allergies     Medication List    STOP taking these medications   ondansetron 4 MG tablet Commonly known as:  ZOFRAN     TAKE these medications   butalbital-acetaminophen-caffeine 50-325-40 MG tablet Commonly known as:  ESGIC Take 1 tablet by mouth every 6 (six) hours as needed for headache.   diazepam 5 MG tablet Commonly known as:  VALIUM Take 1 tablet (5 mg total) by mouth 2 (two) times daily. As needed. What changed:  when to take this  reasons to take this  additional instructions   gabapentin 600 MG tablet Commonly known as:  NEURONTIN Take 0.5 tablets (300 mg total) by mouth at bedtime.   ibuprofen 200 MG tablet Commonly known as:  ADVIL,MOTRIN Take 200-400 mg by mouth every 6 (six) hours as needed for moderate pain.   levonorgestrel-ethinyl estradiol 0.15-0.03 MG tablet Commonly known as:  SEASONALE,INTROVALE,JOLESSA Take 1 tablet by mouth daily.   pantoprazole 40 MG tablet Commonly known as:  PROTONIX Take 1 tablet (40 mg total) by mouth daily at 12 noon.       Discharge Assessment: Vitals:   07/18/17 2111 07/19/17 0531  BP: 114/74 113/66  Pulse: 76 81  Resp: 18 18  Temp: 98.5 F (36.9 C) 98.4 F (36.9 C)   Skin clean, dry and intact without evidence of skin break down, no evidence of skin tears noted. IV catheter discontinued intact. Site without signs and symptoms of complications - no redness or edema noted at insertion site, patient denies c/o pain - only slight tenderness at site.  Dressing with slight pressure applied.  D/c Instructions-Education: Discharge instructions given to patient/family with verbalized understanding. D/c education  completed with patient/family including follow up instructions, medication list, d/c activities limitations if indicated, with other d/c instructions as indicated by MD - patient able to verbalize understanding, all questions fully answered. Patient instructed to return to ED, call 911, or call MD for any changes in condition.  Patient escorted via WC, and D/C home via private auto.  Kayven Aldaco Consuella Lose, RN 07/19/2017 3:17 PM

## 2017-07-20 LAB — HIV ANTIBODY (ROUTINE TESTING W REFLEX): HIV SCREEN 4TH GENERATION: NONREACTIVE

## 2017-07-21 NOTE — Telephone Encounter (Signed)
These medications were prescribed again on 7/20 after OV. MRI showed Multiple Sclerosis and she was admitted to Hospital same day. Seen by Neurologist who needs to approve of these medications.

## 2017-07-26 ENCOUNTER — Encounter: Payer: Self-pay | Admitting: Neurology

## 2017-07-26 ENCOUNTER — Ambulatory Visit (INDEPENDENT_AMBULATORY_CARE_PROVIDER_SITE_OTHER): Payer: BC Managed Care – PPO | Admitting: Neurology

## 2017-07-26 VITALS — BP 118/67 | HR 52 | Resp 16 | Ht 65.0 in | Wt 140.0 lb

## 2017-07-26 DIAGNOSIS — Z79899 Other long term (current) drug therapy: Secondary | ICD-10-CM | POA: Diagnosis not present

## 2017-07-26 DIAGNOSIS — R260 Ataxic gait: Secondary | ICD-10-CM | POA: Insufficient documentation

## 2017-07-26 DIAGNOSIS — G35 Multiple sclerosis: Secondary | ICD-10-CM | POA: Diagnosis not present

## 2017-07-26 DIAGNOSIS — R200 Anesthesia of skin: Secondary | ICD-10-CM

## 2017-07-26 DIAGNOSIS — E559 Vitamin D deficiency, unspecified: Secondary | ICD-10-CM

## 2017-07-26 DIAGNOSIS — R7989 Other specified abnormal findings of blood chemistry: Secondary | ICD-10-CM | POA: Insufficient documentation

## 2017-07-26 DIAGNOSIS — G43009 Migraine without aura, not intractable, without status migrainosus: Secondary | ICD-10-CM

## 2017-07-26 NOTE — Progress Notes (Signed)
[facility]  PATIENT: Michele Lane DOB: Jan 14, 1986  REFERRING DOCTOR OR PCP:  Keitha Butte SOURCE: patient, family, PCP notes, radiology/lab results, ED/hospital notes, MRI images on PACS  _________________________________   HISTORICAL  CHIEF COMPLAINT:  Chief Complaint  Patient presents with  . Abnormal MRI    Michele Lane is here with her mother and boyfriend to discuss MRI results.  On 07/01/17, she had onset of left sided facial numbness, weakness, difficulty with gait/balance, severe h/a different from past migraines.  MRI suggestive of MS.  She was admitted to [facility] , no LP done.  Had 5 days of IV SM.  Facial numbness much improved but still present around mouth.  H/A , dizziness and gait/balance disturbance have resolved./fim  . Numbness  . Gait Disturbance  . Migraine    HISTORY OF PRESENT ILLNESS:  I had the pleasure seeing you patient, Michele Lane, at the [place] center at [facility] for neurologic consultation regarding new diagnosis of multiple sclerosis.  On July 6th, 2018, she woke up with left lip numbness that progressed to a larger area as the day went on.   The next morning she was clumsy when she noted her walking was more off balanced.     She also had a headache but it was different than her typical migraine.   Two days later, she went to the ED.   Her pulse was in the 30's and they focused on that. A CT was done and she was told it was normal.   She was given a 'migraine cocktail'.  The HA was mildly better until 2 days later when she woke up and had poor balance and extreme nausea and vomiting.    She went back to the ED.    She saw Dr. Mitchel Honour 07/09/17 and he ordered an MRI that was performed 07/15/17.   It was c/w MS an she was told to go to the ED.    She was admitted admitted and received 5 days of IV Solu-Medrol.   She improved that week and continues to improve.  Currently, she denies any difficulties with her gait. There  is no weakness in the arms or legs or face. She does not note any numbness in the arms or legs but she continues to have mild facial numbness on the left. This improved quite a bit from the intensity of couple weeks ago.  Bladder function is fine. Vision is normal. She denies any diplopia or eye pain.  She denies any difficulties with depression or anxiety. She has not noted any cognitive difficulties. While in the hospital she felt tired but she feels back to her baseline now. She remains active we discussed that she is fine to resume normal activities like exercise and tennis. She sleeps well.   I personally reviewed the MRIs of the brain from 07/15/2017 and the cervical and thoracic spine from 07/17/2017. The MRI of the brain shows an enhancing lesion in the left middle cerebellar peduncle. Additionally, there are many other foci in the periventricular, juxtacortical and deep white matter in a configuration consistent with multiple sclerosis. Seven of the hemispheric lesions also enhanced.   MRI of the cervical spine shows a focus at C3-C4 to the right. It does not enhance.  There is also a right paramedian disc herniation at C5-C6 and anterior thecal sac.    The MRI of the thoracic spine is normal.   I reviewed left. Tests from the hospital and as an  outpatient earlier. The ANA, Lyme, ESR' \and'  HIV were negative or normal.  His second neurological problem is episodic migraine headaches. She averages 4-8 a month. They tend to cluster once or twice a month with 3 or 4 days in a row. They're not associated with her menstrual cycle. Typically she will get a visual aura though the occasional migraine occurs with mild vision changes without aura.  The pain is throbbing and usually unilateral when it occurs. If she takes a Fioricet during the aura, her headaches tend to be very short order.. Fioricet works almost every time. She has never been on a prophylactic agent. She has never been on a triptan.   REVIEW  OF SYSTEMS: Constitutional: No fevers, chills, sweats, or change in appetite Eyes: No visual changes, double vision, eye pain Ear, nose and throat: No hearing loss, ear pain, nasal congestion, sore throat Cardiovascular: No chest pain, palpitations Respiratory: No shortness of breath at rest or with exertion.   No wheezes GastrointestinaI: No nausea, vomiting, diarrhea, abdominal pain, fecal incontinence Genitourinary: No dysuria, urinary retention or frequency.  No nocturia. Musculoskeletal: No neck pain, back pain Integumentary: No rash, pruritus, skin lesions Neurological: as above Psychiatric: No depression at this time.  No anxiety Endocrine: No palpitations, diaphoresis, change in appetite, change in weigh or increased thirst Hematologic/Lymphatic: No anemia, purpura, petechiae. Allergic/Immunologic: No itchy/runny eyes, nasal congestion, recent allergic reactions, rashes  ALLERGIES: No Known Allergies  HOME MEDICATIONS:  Current Outpatient Prescriptions:  .  butalbital-acetaminophen-caffeine (ESGIC) 50-325-40 MG tablet, Take 1 tablet by mouth every 6 (six) hours as needed for headache., Disp: 30 tablet, Rfl: 0 .  levonorgestrel-ethinyl estradiol (SEASONALE,INTROVALE,JOLESSA) 0.15-0.03 MG tablet, Take 1 tablet by mouth daily., Disp: , Rfl: 0 .  pantoprazole (PROTONIX) 40 MG tablet, Take 1 tablet (40 mg total) by mouth daily at 12 noon., Disp: 14 tablet, Rfl: 0 .  diazepam (VALIUM) 5 MG tablet, Take 1 tablet (5 mg total) by mouth 2 (two) times daily. As needed. (Patient taking differently: Take 5 mg by mouth every 12 (twelve) hours as needed for anxiety or muscle spasms. As needed.), Disp: 20 tablet, Rfl: 0 .  gabapentin (NEURONTIN) 600 MG tablet, Take 0.5 tablets (300 mg total) by mouth at bedtime. (Patient not taking: Reported on 07/26/2017), Disp: 30 tablet, Rfl: 0  PAST MEDICAL HISTORY: Past Medical History:  Diagnosis Date  . Bradycardia   . Hypoglycemia   . Hypoglycemia    . Migraine     PAST SURGICAL HISTORY: Past Surgical History:  Procedure Laterality Date  . WISDOM TOOTH EXTRACTION      FAMILY HISTORY: Family History  Problem Relation Age of Onset  . Neuropathy Mother   . Kidney cancer Father   . Diabetes Maternal Grandfather   . Heart block Paternal Grandfather        Required Pacemaker placement in his 51's    SOCIAL HISTORY:  Social History   Social History  . Marital status: Single    Spouse name: N/A  . Number of children: N/A  . Years of education: N/A   Occupational History  . Not on file.   Social History Main Topics  . Smoking status: Never Smoker  . Smokeless tobacco: Never Used  . Alcohol use 0.6 oz/week    1 Glasses of wine per week  . Drug use: No  . Sexual activity: Yes    Birth control/ protection: Pill   Other Topics Concern  . Not on file   Social History  Narrative   [employer] - class scheduler     PHYSICAL EXAM  Vitals:   07/26/17 0854  BP: 118/67  Pulse: (!) 52  Resp: 16  Weight: 140 lb (63.5 kg)  Height: '5\' 5"'  (1.651 m)    Body mass index is 23.3 kg/m.   General: The patient is well-developed and well-nourished and in no acute distress  Eyes:  Funduscopic exam shows normal optic discs and retinal vessels.  Neck: The neck is supple, no carotid bruits are noted.  The neck is nontender.  Cardiovascular: The heart has a regular rate and rhythm with a normal S1 and S2. There were no murmurs, gallops or rubs. Lungs are clear to auscultation.  Skin: Extremities are without significant edema.  Musculoskeletal:  Back is nontender  Neurologic Exam  Mental status: The patient is alert and oriented x 3 at the time of the examination. The patient has apparent normal recent and remote memory, with an apparently normal attention span and concentration ability.   Speech is normal.  Cranial nerves: Extraocular movements are full. Pupils are equal, round, and reactive to light and accomodation.  Visual  fields are full.  Facial symmetry is present. There is good facial sensation to soft touch bilaterally.Facial strength is normal.  Trapezius and sternocleidomastoid strength is normal. No dysarthria is noted.  The tongue is midline, and the patient has symmetric elevation of the soft palate. No obvious hearing deficits are noted.  Motor:  Muscle bulk is normal.   Tone is normal. Strength is  5 / 5 in all 4 extremities.   Sensory: Sensory testing is intact to pinprick, soft touch and vibration sensation in all 4 extremities.  Coordination: Cerebellar testing reveals good finger-nose-finger and heel-to-shin bilaterally.  Gait and station: Station is normal.   Gait is normal. Tandem gait is normal. Romberg is negative.   Reflexes: Deep tendon reflexes are symmetric and normal bilaterally.   Plantar responses are flexor.    DIAGNOSTIC DATA (LABS, IMAGING, TESTING) - I reviewed patient records, labs, notes, testing and imaging myself where available.  Lab Results  Component Value Date   WBC 16.6 (H) 07/19/2017   HGB 11.2 (L) 07/19/2017   HCT 33.9 (L) 07/19/2017   MCV 83.5 07/19/2017   PLT 242 07/19/2017      Component Value Date/Time   NA 140 07/18/2017 0143   NA 147 (H) 07/09/2017 1150   K 4.1 07/18/2017 0143   CL 108 07/18/2017 0143   CO2 24 07/18/2017 0143   GLUCOSE 118 (H) 07/18/2017 0143   BUN 14 07/18/2017 0143   BUN 10 07/09/2017 1150   CREATININE 0.87 07/18/2017 0143   CREATININE 0.74 02/06/2013 1559   CALCIUM 9.1 07/18/2017 0143   PROT 7.2 07/15/2017 2028   PROT 7.0 07/09/2017 1150   ALBUMIN 4.2 07/15/2017 2028   ALBUMIN 4.4 07/09/2017 1150   AST 25 07/15/2017 2028   ALT 25 07/15/2017 2028   ALKPHOS 53 07/15/2017 2028   BILITOT 0.4 07/15/2017 2028   BILITOT 0.3 07/09/2017 1150   GFRNONAA >60 07/18/2017 0143   GFRAA >60 07/18/2017 0143   No results found for: CHOL, HDL, LDLCALC, LDLDIRECT, TRIG, CHOLHDL No results found for: HGBA1C No results found for:  VITAMINB12 Lab Results  Component Value Date   TSH 2.218 07/16/2017       ASSESSMENT AND PLAN  Multiple sclerosis ([place]) - Plan: Hepatitis B core antibody, total, Hepatitis B surface antigen, Quantiferon tb gold assay (blood), VITAMIN D 25 Hydroxy (Vit-D  Deficiency, Fractures), Hepatitis B surface antibody, Stratify JCV Antibody Test ([facility])  Atypical migraine - Plan: Pan-ANCA  Facial numbness - Plan: Pan-ANCA, Vitamin B12  Ataxic gait - Plan: Vitamin B12  Low vitamin D level - Plan: VITAMIN D 25 Hydroxy (Vit-D Deficiency, Fractures)  High risk medication use - Plan: Hepatitis B core antibody, total, Hepatitis B surface antigen, Quantiferon tb gold assay (blood), Hepatitis B surface antibody, Vitamin B12    In summary, Michele Lane is a 31 year old woman with an episode of left facial numbness and clumsiness likely due to the larger left middle cerebellar peduncle lesion noted on the MRI. I discussed with her and her family that the combination of her clinical symptoms and the very classic appearance of the MRI (brain and spine) allows [place] to consider her clinically definite multiple sclerosis.   Therefore, I recommend treatment with one of the MS disease modifying therapies.   Due to the combination of a significant exacerbation from a brainstem lesion and her already having one spinal lesion, I would recommend that she go on a highly effective medication from the onset. Specifically I would recommend that she start either Tysabri or ocrelizumab. We also discussed Tecfidera and Gilenya if bloodwork rules these out.  We went over the risks and benefits of Tysabri and Ocrevus and she understands the risk of PML might be dependent on whether she is positive or negative on the JCV antibody test. If she is JCV antibody positive she will start Ocrevus. If she is antibody negative, shw will likely start Tysabri but would also consider Ocrevus. We will check blood work for JCV antibody, hepatitis B  status, TB. Additionally, as low vitamin D has been associated with doing worse with MS we will check a vitamin D level and a vitamin B12 level.    We also discussed her headaches.  She continues to do well on Fioricet and is not interested in a prophylactic agent at this time.  I will see her back for a regularly scheduled visit in 3 months but she will be back at the office for her first infusion within a few weeks. Additionally she is to call us back sooner if she has any new or worsening neurologic symptoms.   Thank you for asking me to see Michele Lane at the [facility] at [facility].   Please let me know if I can be of further assistance with her or other patients in the future.   Mont Jagoda A. Felecia Shelling, MD, PhD, FAAN Certified in Neurology, Clinical Neurophysiology, Sleep Medicine, Pain Medicine and Neuroimaging Director, [place] [facility] [address] [place], [place] [address] [phone]

## 2017-07-27 ENCOUNTER — Telehealth: Payer: Self-pay | Admitting: *Deleted

## 2017-07-27 ENCOUNTER — Other Ambulatory Visit (INDEPENDENT_AMBULATORY_CARE_PROVIDER_SITE_OTHER): Payer: Self-pay

## 2017-07-27 DIAGNOSIS — Z0289 Encounter for other administrative examinations: Secondary | ICD-10-CM

## 2017-07-27 LAB — VITAMIN D 25 HYDROXY (VIT D DEFICIENCY, FRACTURES): VIT D 25 HYDROXY: 30.8 ng/mL (ref 30.0–100.0)

## 2017-07-27 LAB — HEPATITIS B CORE ANTIBODY, TOTAL: HEP B C TOTAL AB: NEGATIVE

## 2017-07-27 LAB — VITAMIN B12: Vitamin B-12: 279 pg/mL (ref 232–1245)

## 2017-07-27 LAB — HEPATITIS B SURFACE ANTIGEN: HEP B S AG: NEGATIVE

## 2017-07-27 LAB — HEPATITIS B SURFACE ANTIBODY,QUALITATIVE: Hep B Surface Ab, Qual: REACTIVE

## 2017-07-27 NOTE — Telephone Encounter (Signed)
FMLA paperwrok completed and faxed to [facility] fax# [phone], per pt's request/fim

## 2017-07-29 ENCOUNTER — Encounter: Payer: Self-pay | Admitting: Emergency Medicine

## 2017-07-29 ENCOUNTER — Ambulatory Visit (INDEPENDENT_AMBULATORY_CARE_PROVIDER_SITE_OTHER): Payer: BC Managed Care – PPO | Admitting: Emergency Medicine

## 2017-07-29 VITALS — BP 103/54 | HR 43 | Temp 98.8°F | Resp 16 | Ht 65.25 in | Wt 139.6 lb

## 2017-07-29 DIAGNOSIS — G35 Multiple sclerosis: Secondary | ICD-10-CM | POA: Diagnosis not present

## 2017-07-29 NOTE — Progress Notes (Signed)
Michele Lane 31 y.o.   Chief Complaint  Patient presents with  . Follow-up    ED visit [facility] for MS    HISTORY OF PRESENT ILLNESS: [name] here for follow up of recent hospital admission; recently diagnosed with MS; just saw Neurologist 7/31; feels much better; has no complaints.  HPI   Prior to Admission medications   Medication Sig Start Date End Date Taking? Authorizing Provider  butalbital-acetaminophen-caffeine [facility]) 50-325-40 MG tablet Take 1 tablet by mouth every 6 (six) hours as needed for headache. 07/15/17  Yes Wanette Robison, Eilleen Kempf, MD  levonorgestrel-ethinyl estradiol (SEASONALE,INTROVALE,JOLESSA) 0.15-0.03 MG tablet Take 1 tablet by mouth daily. 11/28/15  Yes [provider]  pantoprazole (PROTONIX) 40 MG tablet Take 1 tablet (40 mg total) by mouth daily at 12 noon. 07/19/17  Yes Alison Murray, MD  diazepam (VALIUM) 5 MG tablet Take 1 tablet (5 mg total) by mouth 2 (two) times daily. As needed. Patient not taking: Reported on 07/29/2017 07/15/17   Georgina Quint, MD  gabapentin (NEURONTIN) 600 MG tablet Take 0.5 tablets (300 mg total) by mouth at bedtime. Patient not taking: Reported on 07/26/2017 07/19/17   Alison Murray, MD    No Known Allergies  Patient Active Problem List   Diagnosis Date Noted  . 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 Low vitamin D level 07/26/2017  . High risk medication use 07/26/2017  . Multiple sclerosis (HCC) 07/16/2017  . Bradycardia 07/16/2017  . Nausea and vomiting 07/15/2017  . Facial numbness 07/09/2017  . Intractable headache 07/09/2017  . Dizziness 07/09/2017  . General weakness 07/09/2017  . 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 History of hypoglycemia 02/06/2013  . Uses oral contraception 02/06/2013    Past Medical History:  Diagnosis Date  . Bradycardia   . Hypoglycemia   . Hypoglycemia   . Migraine     Past Surgical History:  Procedure Laterality Date  . WISDOM TOOTH EXTRACTION      Social  History   Social History  . Marital status: Single    Spouse name: N/A  . Number of children: N/A  . Years of education: N/A   Occupational History  . Not on file.   Social History Main Topics  . Smoking status: Never Smoker  . Smokeless tobacco: Never Used  . Alcohol use 0.6 oz/week    1 Glasses of wine per week  . Drug use: No  . Sexual activity: Yes    Birth control/ protection: Pill   Other Topics Concern  . Not on file   Social History Narrative   [employer] - class scheduler    Family History  Problem Relation Age of Onset  . Neuropathy Mother   . Kidney cancer Father   . Diabetes Maternal Grandfather   . Heart block Paternal Grandfather        Required Pacemaker placement in his 95's     Review of Systems  Constitutional: Negative.  Negative for chills and fever.  HENT: Negative for ear pain, hearing loss and sore throat.   Eyes: Negative for blurred vision and double vision.  Respiratory: Negative for cough and shortness of breath.   Cardiovascular: Negative for chest pain and palpitations.  Gastrointestinal: Negative for nausea and vomiting.  Genitourinary: Negative for dysuria and hematuria.  Skin: Negative for rash.  Neurological: Negative for dizziness.  Endo/Heme/Allergies: Negative.   All other systems reviewed and are negative.   Vitals:   07/29/17 1110  BP: Marland Kitchen)  103/54  Pulse: (!) 43  Resp: 16  Temp: 98.8 F (37.1 C)    Physical Exam  Constitutional: She is oriented to person, place, and time. She appears well-developed and well-nourished.  HENT:  Head: Normocephalic and atraumatic.  Eyes: Pupils are equal, round, and reactive to light. EOM are normal.  Neck: Normal range of motion.  Cardiovascular: Normal rate.   Pulmonary/Chest: Effort normal.  Musculoskeletal: Normal range of motion.  Neurological: She is alert and oriented to person, place, and time.  Skin: Skin is warm and dry. Capillary refill takes less than 2 seconds. No rash  noted.  Psychiatric: She has a normal mood and affect. Her behavior is normal.  Vitals reviewed.    ASSESSMENT & PLAN: Sawyer was seen today for follow-up.  Diagnoses and all orders for this visit:  Multiple sclerosis (HCC)    Stable and asymptomatic. Patient Instructions       IF you received an x-ray today, you will receive an invoice from [facility]. Please contact [facility] at [phone] with questions or concerns regarding your invoice.   IF you received labwork today, you will receive an invoice from [facility]. Please contact [facility] at [phone] with questions or concerns regarding your invoice.   Our billing staff will not be able to assist you with questions regarding bills from these companies.  You will be contacted with the lab results as soon as they are available. The fastest way to get your results is to activate your My Chart account. Instructions are located on the last page of this paperwork. If you have not heard from [place] regarding the results in 2 weeks, please contact this office.    Multiple Sclerosis Multiple sclerosis (MS) is a disease of the central nervous system. It leads to the loss of the insulating covering of the nerves (myelin sheath) of your brain. When this happens, brain signals do not get sent properly or may not get sent at all. The age of onset of MS varies. What are the causes? The cause of MS is unknown. However, it is more common in the [place] than in the [place]. What increases the risk? There is a higher number of women with MS than men. MS is not an illness that is passed down to you from your family members (inherited). However, your risk of MS is higher if you have a relative with MS. What are the signs or symptoms? The symptoms of MS occur in episodes or attacks. These attacks may last weeks to months. There may be long periods of almost no symptoms between attacks. The  symptoms of MS vary. This is because of the many different ways it affects the central nervous system. The main symptoms of MS include:  Vision problems and eye pain.  Numbness.  Weakness.  Inability to move your arms, hands, feet, or legs (paralysis).  Balance problems.  Tremors.  How is this diagnosed? Your health care provider can diagnose MS with the help of imaging exams and lab tests. These may include specialized X-ray exams and spinal fluid tests. The best imaging exam to confirm a diagnosis of MS is an MRI. How is this treated? There is no known cure for MS, but there are medicines that can decrease the number and frequency of attacks. Steroids are often used for short-term relief. Physical and occupational therapy may also help. There are also many new alternative or complementary treatments available to help control the symptoms of MS. Ask  your health care provider if any of these other options are right for you. Follow these instructions at home:  Take medicines as directed by your health care provider.  Exercise as directed by your health care provider. Contact a health care provider if: You begin to feel depressed. Get help right away if:  You develop paralysis.  You have problems with bladder, bowel, or sexual function.  You develop mental changes, such as forgetfulness or mood swings.  You have a period of uncontrolled movements (seizure). This information is not intended to replace advice given to you by your health care provider. Make sure you discuss any questions you have with your health care provider. Document Released: 12/10/2000 Document Revised: 05/20/2016 Document Reviewed: 08/20/2013 Elsevier Interactive Patient Education  2017 Elsevier Inc.      Edwina Barth, MD [facility]

## 2017-07-29 NOTE — Patient Instructions (Addendum)
     IF you received an x-ray today, you will receive an invoice from [facility]. Please contact [facility] at [phone] with questions or concerns regarding your invoice.   IF you received labwork today, you will receive an invoice from [facility]. Please contact [facility] at [phone] with questions or concerns regarding your invoice.   Our billing staff will not be able to assist you with questions regarding bills from these companies.  You will be contacted with the lab results as soon as they are available. The fastest way to get your results is to activate your My Chart account. Instructions are located on the last page of this paperwork. If you have not heard from [place] regarding the results in 2 weeks, please contact this office.    Multiple Sclerosis Multiple sclerosis (MS) is a disease of the central nervous system. It leads to the loss of the insulating covering of the nerves (myelin sheath) of your brain. When this happens, brain signals do not get sent properly or may not get sent at all. The age of onset of MS varies. What are the causes? The cause of MS is unknown. However, it is more common in the [place] than in the [place]. What increases the risk? There is a higher number of women with MS than men. MS is not an illness that is passed down to you from your family members (inherited). However, your risk of MS is higher if you have a relative with MS. What are the signs or symptoms? The symptoms of MS occur in episodes or attacks. These attacks may last weeks to months. There may be long periods of almost no symptoms between attacks. The symptoms of MS vary. This is because of the many different ways it affects the central nervous system. The main symptoms of MS include:  Vision problems and eye pain.  Numbness.  Weakness.  Inability to move your arms, hands, feet, or legs (paralysis).  Balance problems.  Tremors.  How  is this diagnosed? Your health care provider can diagnose MS with the help of imaging exams and lab tests. These may include specialized X-ray exams and spinal fluid tests. The best imaging exam to confirm a diagnosis of MS is an MRI. How is this treated? There is no known cure for MS, but there are medicines that can decrease the number and frequency of attacks. Steroids are often used for short-term relief. Physical and occupational therapy may also help. There are also many new alternative or complementary treatments available to help control the symptoms of MS. Ask your health care provider if any of these other options are right for you. Follow these instructions at home:  Take medicines as directed by your health care provider.  Exercise as directed by your health care provider. Contact a health care provider if: You begin to feel depressed. Get help right away if:  You develop paralysis.  You have problems with bladder, bowel, or sexual function.  You develop mental changes, such as forgetfulness or mood swings.  You have a period of uncontrolled movements (seizure). This information is not intended to replace advice given to you by your health care provider. Make sure you discuss any questions you have with your health care provider. Document Released: 12/10/2000 Document Revised: 05/20/2016 Document Reviewed: 08/20/2013 Elsevier Interactive Patient Education  2017 [employer].

## 2017-07-30 LAB — QUANTIFERON TB GOLD ASSAY (BLOOD)

## 2017-07-30 LAB — QUANTIFERON IN TUBE
QUANTIFERON NIL VALUE: 0.09 [IU]/mL
QUANTIFERON TB AG VALUE: 0.08 IU/mL
QUANTIFERON TB GOLD: NEGATIVE

## 2017-08-01 ENCOUNTER — Telehealth: Payer: Self-pay | Admitting: Neurology

## 2017-08-01 NOTE — Telephone Encounter (Signed)
Laboratory tests came back negative for the JCV antibody, psotitive for the HepBSAb and negative for TB and other HepB tests.     I gave her a call with the results. She is interested in starting ocrelizumab and we will get that form sent in.  Her vitamin D returned low normal and she will continue on 5000 units daily supplementation.Marland Kitchen

## 2017-08-11 NOTE — Telephone Encounter (Signed)
After speaking with Inetta Fermo in the infusion suite, I spoke with Arline Asp and explained we are waiting for ins. approval for Ocrevus.  Extended, pleasant conversation in which many questions were answered/fim

## 2017-08-11 NOTE — Telephone Encounter (Signed)
Patient called office after speaking with her mother who advised her to call and speak with Faith.  Please call

## 2017-08-11 NOTE — Telephone Encounter (Signed)
I have spoken with Michele Lane this afternoon.  Extended, pleasant conversation regarding Mendel Ryder, [facility], cost of MS med, pt. assistance, etc/fim

## 2017-08-11 NOTE — Telephone Encounter (Signed)
Pt's mother/Cindy called the clinic wanting to know the status of ocrevus. Please call her [phone]

## 2017-08-11 NOTE — Telephone Encounter (Signed)
Pt mother has called back asking to speak with RN Faith, please call

## 2017-08-11 NOTE — Telephone Encounter (Signed)
Patient mother called office returning RN's call.  Please call back in about 45 mins or longer please.

## 2017-08-11 NOTE — Telephone Encounter (Signed)
I have spoken with [facility] this afternoon.  Extended, pleasant conversation about process of setting up Ocrevus infusions, ins. approvals/pa process, pt. assistance.  She sts. she has spoken with Mendel Ryder and has started the process of signing up for pt. assistance.  She will watch for further calls from [place] and [facility]/fim

## 2017-09-24 IMAGING — MR MR CERVICAL SPINE WO/W CM
9 of 19 series · 19 of 48 positions shown · IV contrast (cc MH)
Comparison: None.

CLINICAL DATA: New development of left facial numbness, headache
and dizziness over the last 2 weeks. Brain examination showed a
multiple T2 lesions suggesting multiple sclerosis.

EXAM:
MRI CERVICAL SPINE WITHOUT AND WITH CONTRAST
TECHNIQUE: Multiplanar and multiecho pulse sequences of the cervical spine, to
include the craniocervical junction and cervicothoracic junction,
were obtained without and with intravenous contrast.
CONTRAST:  15 cc MultiHance

[Series 2: T2 · sagittal · 3.0mm · 0.43mm/px · 1 of 15 slices shown (1 of 5)]
[im 1/15]
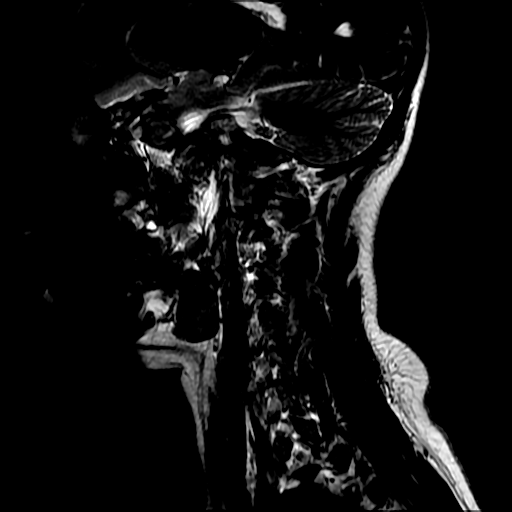

[Series 8: T2 · axial · 3.0mm · 0.35mm/px · z∈[-10,+84]mm · 3 of 29 slices shown (2 of 5)]
[im 1/29]
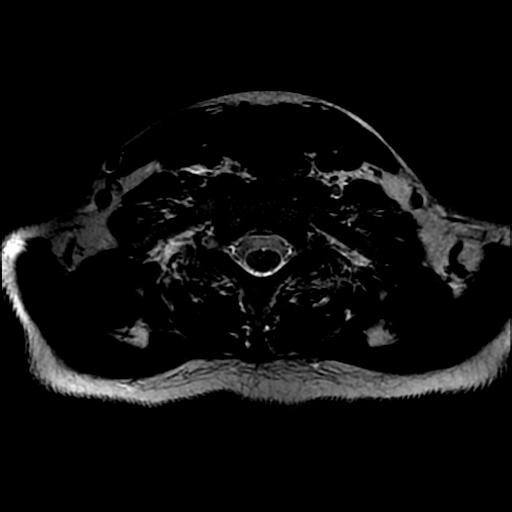
[im 15/29]
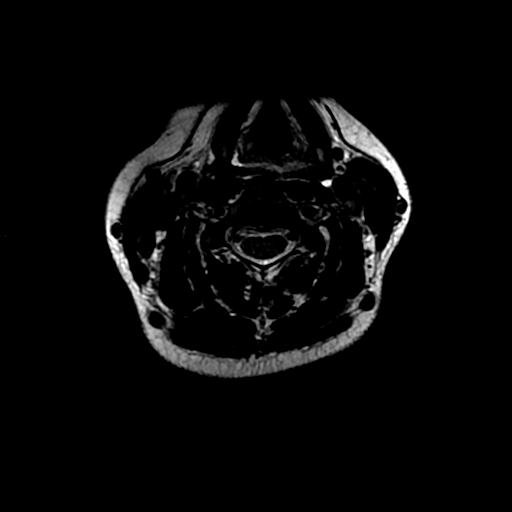
[im 29/29]
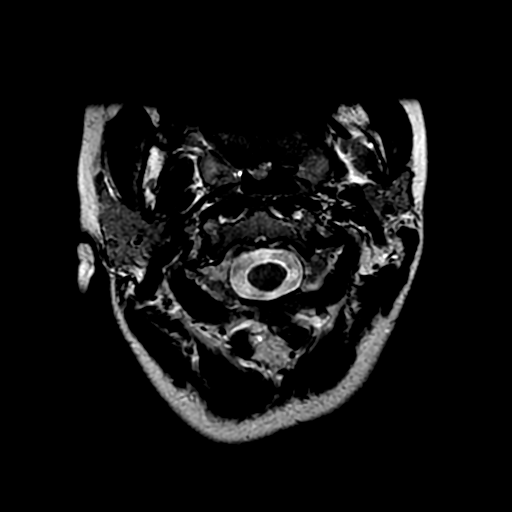

[Series 9: T1 · axial · non-contrast · 3.0mm · 0.35mm/px · z∈[-10,+84]mm · 3 of 29 slices shown (1 of 4)]
[im 1/29]
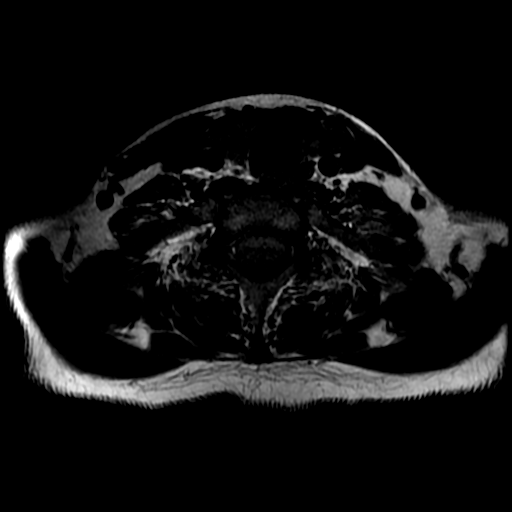
[im 15/29]
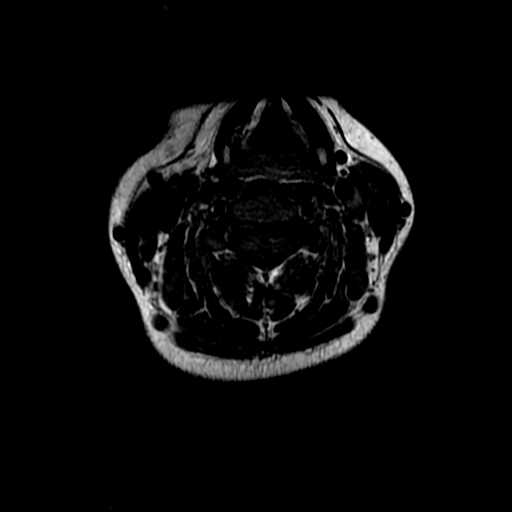
[im 29/29]
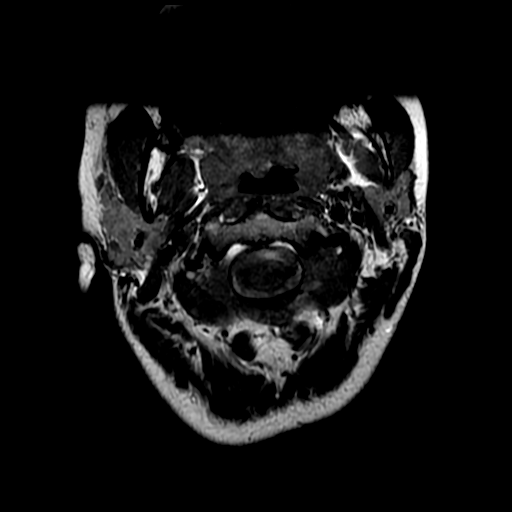

[Series 11: T1 · sagittal · 3.0mm · 0.90mm/px · 1 of 13 slices shown (2 of 4)]
[im 1/13]
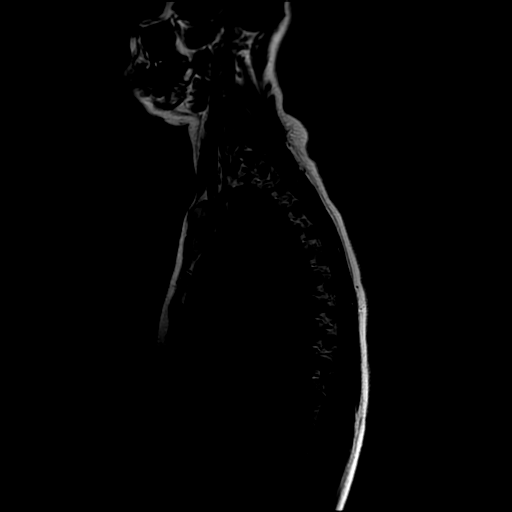

[Series 12: T2 · sagittal · 3.0mm · 0.66mm/px · 2 of 16 slices shown (3 of 5)]
[im 1/16]
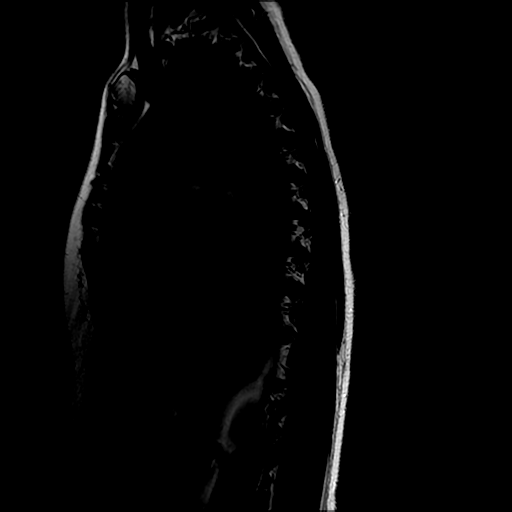
[im 16/16]
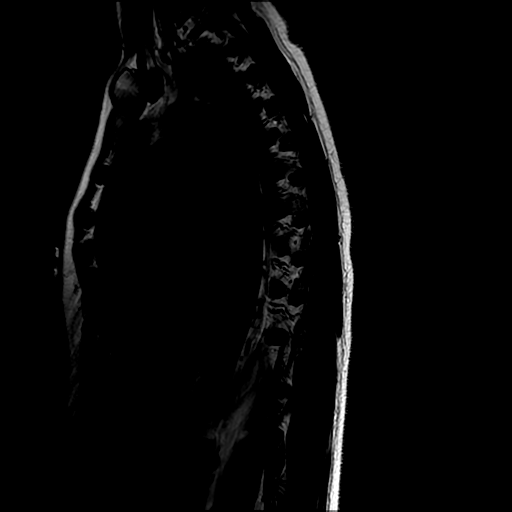

[Series 14: T1 · sagittal · 3.0mm · 0.66mm/px · 2 of 16 slices shown (3 of 4)]
[im 1/16]
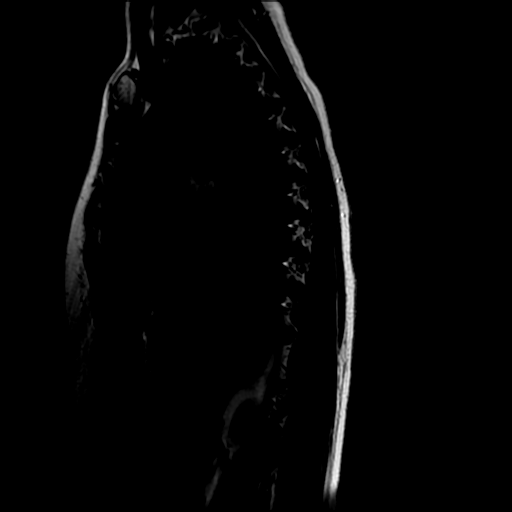
[im 16/16]
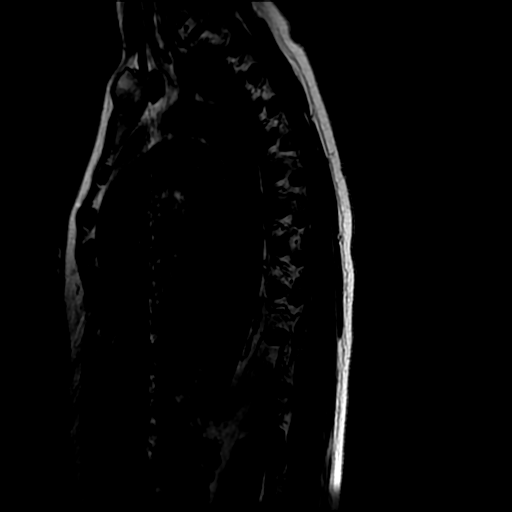

[Series 16: T2 · axial · 4.0mm · 0.39mm/px · z∈[-156,-20]mm · 3 of 28 slices shown (4 of 5)]
[im 1/28]
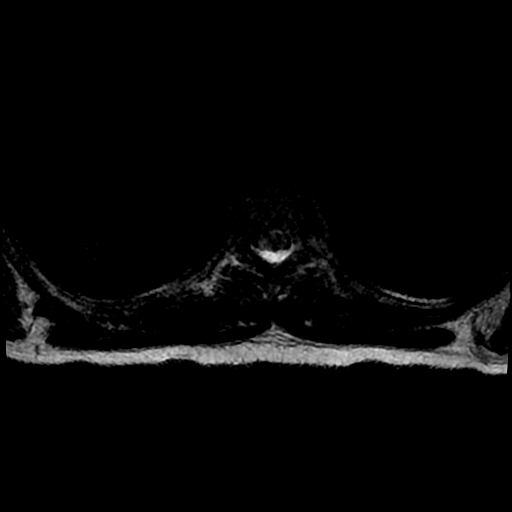
[im 14/28]
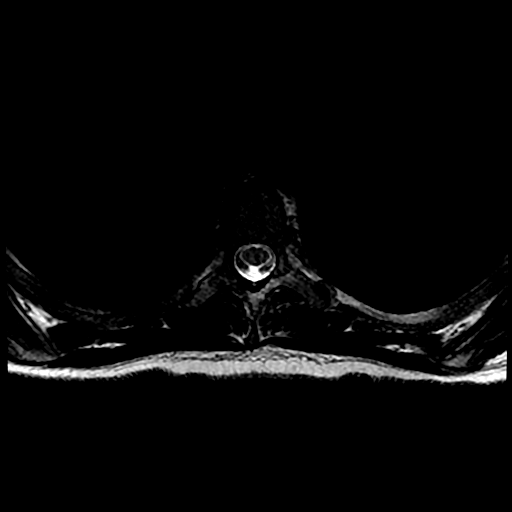
[im 28/28]
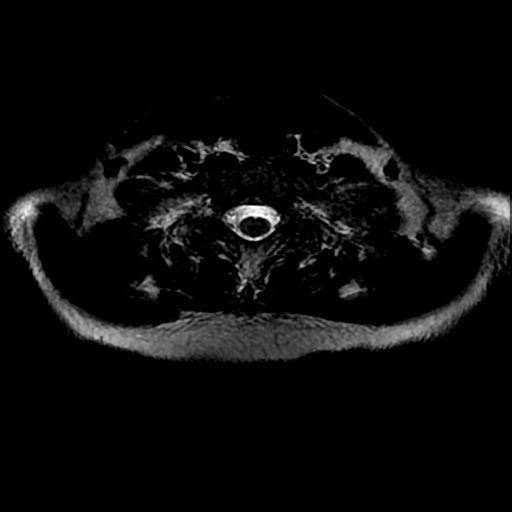

[Series 18: T2 · axial · 4.0mm · 0.39mm/px · z∈[-267,-119]mm · 3 of 28 slices shown (5 of 5)]
[im 1/28]
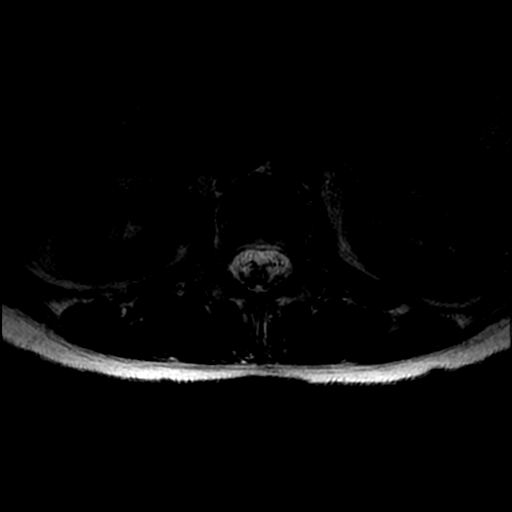
[im 14/28]
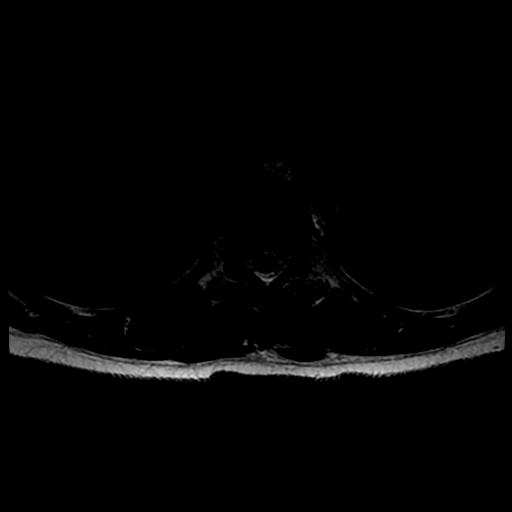
[im 28/28]
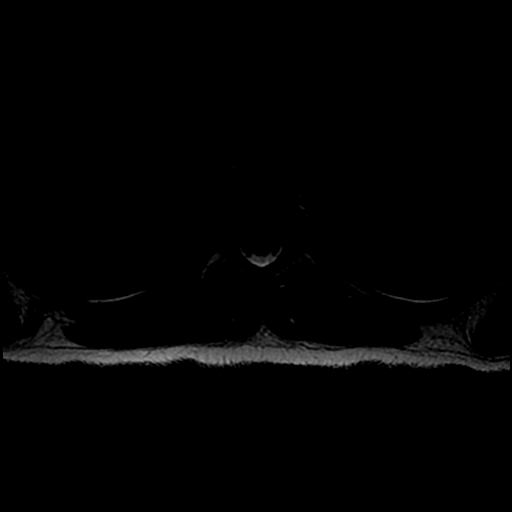

[Series 20: T1 · axial · non-contrast · 4.0mm · 0.39mm/px · 1 of 51 slices shown (4 of 4)]
[im 1/51]
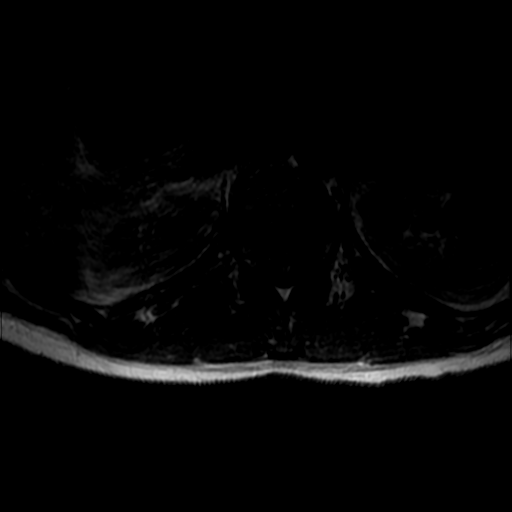

[19 of 48 positions shown; findings below may reference images not displayed]

FINDINGS: Alignment: Straightening of the cervical lordosis in the midcervical
region.

Vertebrae: Degenerative spondylosis C5-6 as described below.

Cord: Single focus of abnormal T2 signal within the right side of
the cord at the C3-4 level, consistent with demyelinating disease.
No other focal involvement.

Posterior Fossa, vertebral arteries, paraspinal tissues: See results
of previous brain exam.

Disc levels:

No abnormality at the foramen magnum, C1-2, C2-3 or C3-4.

C4-5:  Minimal disc bulge.  No stenosis or neural compression.

C5-6: Right paracentral disc herniation and spondylosis, effacing
the ventral subarachnoid space and indenting the right side of the
cord slightly. No foraminal extension.

C6-7:  Normal interspace.

C7-T1:  Normal interspace.
IMPRESSION: Small focus of abnormal T2 signal within the right side of the cord
at the C3-4 level consistent with demyelinating involvement.

Right posterolateral disc herniation at C5-6, effacing the ventral
subarachnoid space and indenting the right side of the cord. No
foraminal extension.

## 2017-10-17 ENCOUNTER — Ambulatory Visit: Payer: BC Managed Care – PPO | Admitting: Neurology

## 2018-02-03 ENCOUNTER — Encounter: Payer: Self-pay | Admitting: Emergency Medicine

## 2018-02-03 ENCOUNTER — Ambulatory Visit: Payer: BC Managed Care – PPO | Admitting: Emergency Medicine

## 2018-02-03 VITALS — BP 122/72 | HR 86 | Temp 98.1°F | Resp 17 | Ht 65.5 in | Wt 147.0 lb

## 2018-02-03 DIAGNOSIS — G35 Multiple sclerosis: Secondary | ICD-10-CM | POA: Diagnosis not present

## 2018-02-03 NOTE — Progress Notes (Signed)
Michele Lane 32 y.o.   Chief Complaint  Patient presents with  . Follow-up    MS     HISTORY OF PRESENT ILLNESS: This is a 32 y.o. female here for follow-up of multiple sclerosis.  Seen by me last August 3.  Seen neurologist and getting treatment for it.  Feels much better.  Has no complaints or any new symptoms.  Staying physically active.  Plays tennis regularly.  Played last night for about an hour and 45 minutes with no problems.  Denies any visual problems or coordination problems.  No balance issues.  Overall doing much better.  HPI   Prior to Admission medications   Medication Sig Start Date End Date Taking? Authorizing Provider  butalbital-acetaminophen-caffeine [facility]) 50-325-40 MG tablet Take 1 tablet by mouth every 6 (six) hours as needed for headache. 07/15/17  Yes Kaci Freel, Eilleen Kempf, MD  levonorgestrel-ethinyl estradiol (SEASONALE,INTROVALE,JOLESSA) 0.15-0.03 MG tablet Take 1 tablet by mouth daily. 11/28/15  Yes [provider]  diazepam (VALIUM) 5 MG tablet Take 1 tablet (5 mg total) by mouth 2 (two) times daily. As needed. Patient not taking: Reported on 07/29/2017 07/15/17   Georgina Quint, MD  gabapentin (NEURONTIN) 600 MG tablet Take 0.5 tablets (300 mg total) by mouth at bedtime. Patient not taking: Reported on 07/26/2017 07/19/17   Alison Murray, MD  pantoprazole (PROTONIX) 40 MG tablet Take 1 tablet (40 mg total) by mouth daily at 12 noon. Patient not taking: Reported on 02/03/2018 07/19/17   Alison Murray, MD    No Known Allergies  Patient Active Problem List   Diagnosis Date Noted  . 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 Low vitamin D level 07/26/2017  . High risk medication use 07/26/2017  . Multiple sclerosis (HCC) 07/16/2017  . Bradycardia 07/16/2017  . Nausea and vomiting 07/15/2017  . Facial numbness 07/09/2017  . Intractable headache 07/09/2017  . Dizziness 07/09/2017  . General weakness 07/09/2017  . 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 History of  hypoglycemia 02/06/2013  . Uses oral contraception 02/06/2013    Past Medical History:  Diagnosis Date  . Bradycardia   . Hypoglycemia   . Hypoglycemia   . Migraine     Past Surgical History:  Procedure Laterality Date  . WISDOM TOOTH EXTRACTION      Social History   Socioeconomic History  . Marital status: Single    Spouse name: Not on file  . Number of children: Not on file  . Years of education: Not on file  . Highest education level: Not on file  Social Needs  . Financial resource strain: Not on file  . Food insecurity - worry: Not on file  . Food insecurity - inability: Not on file  . Transportation needs - medical: Not on file  . Transportation needs - non-medical: Not on file  Occupational History  . Not on file  Tobacco Use  . Smoking status: Never Smoker  . Smokeless tobacco: Never Used  Substance and Sexual Activity  . Alcohol use: Yes    Alcohol/week: 0.6 oz    Types: 1 Glasses of wine per week  . Drug use: No  . Sexual activity: Yes    Birth control/protection: Pill  Other Topics Concern  . Not on file  Social History Narrative   [employer] - class scheduler    Family History  Problem Relation Age of Onset  . Neuropathy Mother   . Kidney cancer Father   . Diabetes Maternal Grandfather   . Heart  block Paternal Grandfather        Required Pacemaker placement in his 44's     Review of Systems  Constitutional: Negative.  Negative for chills, fever and weight loss.  HENT: Negative.  Negative for congestion, hearing loss, nosebleeds and sore throat.   Eyes: Negative.  Negative for blurred vision and double vision.  Respiratory: Negative for cough and shortness of breath.   Cardiovascular: Negative for chest pain, palpitations and leg swelling.  Gastrointestinal: Negative.  Negative for abdominal pain, diarrhea, nausea and vomiting.  Genitourinary: Negative for dysuria and hematuria.  Skin: Negative for rash.  Neurological: Negative.  Negative for  dizziness and headaches.  Endo/Heme/Allergies: Negative.   All other systems reviewed and are negative.   Vitals:   02/03/18 0848  BP: 122/72  Pulse: 86  Resp: 17  Temp: 98.1 F (36.7 C)  SpO2: 98%    Physical Exam  Constitutional: She is oriented to person, place, and time. She appears well-developed and well-nourished.  HENT:  Head: Normocephalic and atraumatic.  Eyes: Conjunctivae and EOM are normal. Pupils are equal, round, and reactive to light.  Neck: Normal range of motion. Neck supple.  Cardiovascular: Normal rate and regular rhythm.  Pulmonary/Chest: Effort normal and breath sounds normal.  Musculoskeletal: Normal range of motion.  Neurological: She is alert and oriented to person, place, and time. No sensory deficit. She exhibits normal muscle tone.  Skin: Skin is warm and dry. Capillary refill takes less than 2 seconds. No rash noted.  Psychiatric: She has a normal mood and affect. Her behavior is normal.  Vitals reviewed.    ASSESSMENT & PLAN: Michele Lane was seen today for follow-up.  Diagnoses and all orders for this visit:  Multiple sclerosis (HCC)  Stable condition. Patient Instructions       IF you received an x-ray today, you will receive an invoice from [facility]. Please contact [facility] at [phone] with questions or concerns regarding your invoice.   IF you received labwork today, you will receive an invoice from [facility]. Please contact [facility] at [phone] with questions or concerns regarding your invoice.   Our billing staff will not be able to assist you with questions regarding bills from these companies.  You will be contacted with the lab results as soon as they are available. The fastest way to get your results is to activate your My Chart account. Instructions are located on the last page of this paperwork. If you have not heard from [place] regarding the results in 2 weeks, please contact this office.      Multiple Sclerosis Multiple sclerosis (MS) is a disease of the central nervous system. It leads to the loss of the insulating covering of the nerves (myelin sheath) of your brain. When this happens, brain signals do not get sent properly or may not get sent at all. The age of onset of MS varies. What are the causes? The cause of MS is unknown. However, it is more common in the [place] than in the [place]. What increases the risk? There is a higher number of women with MS than men. MS is not an illness that is passed down to you from your family members (inherited). However, your risk of MS is higher if you have a relative with MS. What are the signs or symptoms? The symptoms of MS occur in episodes or attacks. These attacks may last weeks to months. There may be long periods of almost no symptoms between attacks.  The symptoms of MS vary. This is because of the many different ways it affects the central nervous system. The main symptoms of MS include:  Vision problems and eye pain.  Numbness.  Weakness.  Inability to move your arms, hands, feet, or legs (paralysis).  Balance problems.  Tremors.  How is this diagnosed? Your health care provider can diagnose MS with the help of imaging exams and lab tests. These may include specialized X-ray exams and spinal fluid tests. The best imaging exam to confirm a diagnosis of MS is an MRI. How is this treated? There is no known cure for MS, but there are medicines that can decrease the number and frequency of attacks. Steroids are often used for short-term relief. Physical and occupational therapy may also help. There are also many new alternative or complementary treatments available to help control the symptoms of MS. Ask your health care provider if any of these other options are right for you. Follow these instructions at home:  Take medicines as directed by your health care provider.  Exercise as directed by  your health care provider. Contact a health care provider if: You begin to feel depressed. Get help right away if:  You develop paralysis.  You have problems with bladder, bowel, or sexual function.  You develop mental changes, such as forgetfulness or mood swings.  You have a period of uncontrolled movements (seizure). This information is not intended to replace advice given to you by your health care provider. Make sure you discuss any questions you have with your health care provider. Document Released: 12/10/2000 Document Revised: 05/20/2016 Document Reviewed: 08/20/2013 Elsevier Interactive Patient Education  2017 [employer]      Edwina Barth, MD [facility]

## 2018-02-03 NOTE — Patient Instructions (Addendum)
     IF you received an x-ray today, you will receive an invoice from [facility]. Please contact [facility] at [phone] with questions or concerns regarding your invoice.   IF you received labwork today, you will receive an invoice from [facility]. Please contact [facility] at [phone] with questions or concerns regarding your invoice.   Our billing staff will not be able to assist you with questions regarding bills from these companies.  You will be contacted with the lab results as soon as they are available. The fastest way to get your results is to activate your My Chart account. Instructions are located on the last page of this paperwork. If you have not heard from us regarding the results in 2 weeks, please contact this office.    Multiple Sclerosis Multiple sclerosis (MS) is a disease of the central nervous system. It leads to the loss of the insulating covering of the nerves (myelin sheath) of your brain. When this happens, brain signals do not get sent properly or may not get sent at all. The age of onset of MS varies. What are the causes? The cause of MS is unknown. However, it is more common in the [place] than in the [place]. What increases the risk? There is a higher number of women with MS than men. MS is not an illness that is passed down to you from your family members (inherited). However, your risk of MS is higher if you have a relative with MS. What are the signs or symptoms? The symptoms of MS occur in episodes or attacks. These attacks may last weeks to months. There may be long periods of almost no symptoms between attacks. The symptoms of MS vary. This is because of the many different ways it affects the central nervous system. The main symptoms of MS include:  Vision problems and eye pain.  Numbness.  Weakness.  Inability to move your arms, hands, feet, or legs (paralysis).  Balance problems.  Tremors.  How  is this diagnosed? Your health care provider can diagnose MS with the help of imaging exams and lab tests. These may include specialized X-ray exams and spinal fluid tests. The best imaging exam to confirm a diagnosis of MS is an MRI. How is this treated? There is no known cure for MS, but there are medicines that can decrease the number and frequency of attacks. Steroids are often used for short-term relief. Physical and occupational therapy may also help. There are also many new alternative or complementary treatments available to help control the symptoms of MS. Ask your health care provider if any of these other options are right for you. Follow these instructions at home:  Take medicines as directed by your health care provider.  Exercise as directed by your health care provider. Contact a health care provider if: You begin to feel depressed. Get help right away if:  You develop paralysis.  You have problems with bladder, bowel, or sexual function.  You develop mental changes, such as forgetfulness or mood swings.  You have a period of uncontrolled movements (seizure). This information is not intended to replace advice given to you by your health care provider. Make sure you discuss any questions you have with your health care provider. Document Released: 12/10/2000 Document Revised: 05/20/2016 Document Reviewed: 08/20/2013 Elsevier Interactive Patient Education  2017 [employer]  

## 2018-02-16 ENCOUNTER — Encounter: Payer: Self-pay | Admitting: Neurology

## 2018-02-22 ENCOUNTER — Other Ambulatory Visit: Payer: Self-pay

## 2018-02-22 ENCOUNTER — Encounter: Payer: Self-pay | Admitting: Neurology

## 2018-02-22 ENCOUNTER — Ambulatory Visit: Payer: BC Managed Care – PPO | Admitting: Neurology

## 2018-02-22 VITALS — BP 119/77 | HR 58 | Resp 14 | Ht 65.5 in | Wt 149.5 lb

## 2018-02-22 DIAGNOSIS — R200 Anesthesia of skin: Secondary | ICD-10-CM

## 2018-02-22 DIAGNOSIS — Z79899 Other long term (current) drug therapy: Secondary | ICD-10-CM

## 2018-02-22 DIAGNOSIS — G35 Multiple sclerosis: Secondary | ICD-10-CM | POA: Diagnosis not present

## 2018-02-22 DIAGNOSIS — R7989 Other specified abnormal findings of blood chemistry: Secondary | ICD-10-CM | POA: Diagnosis not present

## 2018-02-22 MED ORDER — DOXEPIN HCL 10 MG/ML PO CONC: 10.0000 mg | Freq: Every day | ORAL | 3 refills | Status: DC

## 2018-02-22 NOTE — Progress Notes (Signed)
[facility]  PATIENT: Michele Lane DOB: [birth year]  REFERRING DOCTOR OR PCP:  Keitha Butte SOURCE: patient, family, PCP notes, radiology/lab results, ED/hospital notes, MRI images on PACS  _________________________________   HISTORICAL  CHIEF COMPLAINT:  Chief Complaint  Patient presents with  . Multiple Sclerosis    Had parts A and B of first Ocrevus infusion on 09/07/17 and 09/22/17.  Denies new or worsening sx./fim    HISTORY OF PRESENT ILLNESS:   Michele Lane is a 32 year old woman with multiple sclerosis.  Update 02/22/2018: She feels her MS is stable. She had  her first dose of ocrelizumab in September and tolerated it well. She has not had any exacerbations. Her gait returned close to baseline. She does not note any numbness or weakness. The facial numbness resolved for the most part but she will every now and then note mild numbness on the inside of her mouth. She has no difficulty with vision or bladder.  Fatigue is a little low.    If she has a long week at work, she feels more tured but she is still doing everything she needs to.    She has sleep maintenance insomnia.   This is a long term issue that predated the MS.   Gabapentin did not help her sleep,   She denies nay problems with mood.  Cognition is doing well  From 07/26/2017: On July 6th, 2018, she woke up with left lip numbness that progressed to a larger area as the day went on.   The next morning she was clumsy when she noted her walking was more off balanced.     She also had a headache but it was different than her typical migraine.   Two days later, she went to the ED.   Her pulse was in the 30's and they focused on that. A CT was done and she was told it was normal.   She was given a 'migraine cocktail'.  The HA was mildly better until 2 days later when she woke up and had poor balance and extreme nausea and vomiting.    She went back to the ED.    She saw Dr. Mitchel Honour 07/09/17 and he ordered  an MRI that was performed 07/15/17.   It was c/w MS an she was told to go to the ED.    She was admitted admitted and received 5 days of IV Solu-Medrol.   She improved that week and continues to improve.  Currently, she denies any difficulties with her gait. There is no weakness in the arms or legs or face. She does not note any numbness in the arms or legs but she continues to have mild facial numbness on the left. This improved quite a bit from the intensity of couple weeks ago.  Bladder function is fine. Vision is normal. She denies any diplopia or eye pain.  She denies any difficulties with depression or anxiety. She has not noted any cognitive difficulties. While in the hospital she felt tired but she feels back to her baseline now. She remains active we discussed that she is fine to resume normal activities like exercise and tennis. She sleeps well.   I personally reviewed the MRIs of the brain from 07/15/2017 and the cervical and thoracic spine from 07/17/2017. The MRI of the brain shows an enhancing lesion in the left middle cerebellar peduncle. Additionally, there are many other foci in the periventricular, juxtacortical and deep white matter in a configuration consistent with  multiple sclerosis. Seven of the hemispheric lesions also enhanced.   MRI of the cervical spine shows a focus at C3-C4 to the right. It does not enhance.  There is also a right paramedian disc herniation at C5-C6 and anterior thecal sac.    The MRI of the thoracic spine is normal.   I reviewed left. Tests from the hospital and as an outpatient earlier. The ANA, Lyme, ESR' \and'  HIV were negative or normal.  His second neurological problem is episodic migraine headaches. She averages 4-8 a month. They tend to cluster once or twice a month with 3 or 4 days in a row. They're not associated with her menstrual cycle. Typically she will get a visual aura though the occasional migraine occurs with mild vision changes without aura.   The pain is throbbing and usually unilateral when it occurs. If she takes a Fioricet during the aura, her headaches tend to be very short order.. Fioricet works almost every time. She has never been on a prophylactic agent. She has never been on a triptan.   REVIEW OF SYSTEMS: Constitutional: No fevers, chills, sweats, or change in appetite Eyes: No visual changes, double vision, eye pain Ear, nose and throat: No hearing loss, ear pain, nasal congestion, sore throat Cardiovascular: No chest pain, palpitations Respiratory: No shortness of breath at rest or with exertion.   No wheezes GastrointestinaI: No nausea, vomiting, diarrhea, abdominal pain, fecal incontinence Genitourinary: No dysuria, urinary retention or frequency.  No nocturia. Musculoskeletal: No neck pain, back pain Integumentary: No rash, pruritus, skin lesions Neurological: as above Psychiatric: No depression at this time.  No anxiety Endocrine: No palpitations, diaphoresis, change in appetite, change in weigh or increased thirst Hematologic/Lymphatic: No anemia, purpura, petechiae. Allergic/Immunologic: No itchy/runny eyes, nasal congestion, recent allergic reactions, rashes  ALLERGIES: No Known Allergies  HOME MEDICATIONS:  Current Outpatient Medications:  .  butalbital-acetaminophen-caffeine (ESGIC) 50-325-40 MG tablet, Take 1 tablet by mouth every 6 (six) hours as needed for headache., Disp: 30 tablet, Rfl: 0 .  levonorgestrel-ethinyl estradiol (SEASONALE,INTROVALE,JOLESSA) 0.15-0.03 MG tablet, Take 1 tablet by mouth daily., Disp: , Rfl: 0 .  diazepam (VALIUM) 5 MG tablet, Take 1 tablet (5 mg total) by mouth 2 (two) times daily. As needed. (Patient not taking: Reported on 07/29/2017), Disp: 20 tablet, Rfl: 0 .  doxepin (SINEQUAN) 10 MG/ML solution, Take 1 mL (10 mg total) by mouth at bedtime., Disp: 90 mL, Rfl: 3 .  gabapentin (NEURONTIN) 600 MG tablet, Take 0.5 tablets (300 mg total) by mouth at bedtime. (Patient not  taking: Reported on 02/22/2018), Disp: 30 tablet, Rfl: 0 .  pantoprazole (PROTONIX) 40 MG tablet, Take 1 tablet (40 mg total) by mouth daily at 12 noon. (Patient not taking: Reported on 02/03/2018), Disp: 14 tablet, Rfl: 0  PAST MEDICAL HISTORY: Past Medical History:  Diagnosis Date  . Bradycardia   . Hypoglycemia   . Hypoglycemia   . Migraine     PAST SURGICAL HISTORY: Past Surgical History:  Procedure Laterality Date  . WISDOM TOOTH EXTRACTION      FAMILY HISTORY: Family History  Problem Relation Age of Onset  . Neuropathy Mother   . Kidney cancer Father   . Diabetes Maternal Grandfather   . Heart block Paternal Grandfather        Required Pacemaker placement in his 39's    SOCIAL HISTORY:  Social History   Socioeconomic History  . Marital status: Single    Spouse name: Not on file  . Number of  children: Not on file  . Years of education: Not on file  . Highest education level: Not on file  Social Needs  . Financial resource strain: Not on file  . Food insecurity - worry: Not on file  . Food insecurity - inability: Not on file  . Transportation needs - medical: Not on file  . Transportation needs - non-medical: Not on file  Occupational History  . Not on file  Tobacco Use  . Smoking status: Never Smoker  . Smokeless tobacco: Never Used  Substance and Sexual Activity  . Alcohol use: Yes    Alcohol/week: 0.6 oz    Types: 1 Glasses of wine per week  . Drug use: No  . Sexual activity: Yes    Birth control/protection: Pill  Other Topics Concern  . Not on file  Social History Narrative   [employer] - class scheduler     PHYSICAL EXAM  Vitals:   02/22/18 1535  BP: 119/77  Pulse: (!) 58  Resp: 14  Weight: 149 lb 8 oz (67.8 kg)  Height: 5' 5.5" (1.664 m)    Body mass index is 24.5 kg/m.   General: The patient is well-developed and well-nourished and in no acute distress   Neurologic Exam  Mental status: The patient is alert and oriented x 3 at the  time of the examination. The patient has apparent normal recent and remote memory, with an apparently normal attention span and concentration ability.   Speech is normal.  Cranial nerves: Extraocular movements are full. Facial strength and sensation is normal. Trapezius strength is normal.  The tongue is midline, and the patient has symmetric elevation of the soft palate. No obvious hearing deficits are noted.  Motor:  Muscle bulk is normal.   Tone is normal. Strength is  5 / 5 in all 4 extremities.   Sensory: Sensory testing is intact to pinprick, soft touch and vibration sensation in all 4 extremities.  Coordination: Cerebellar testing reveals slightly reduced left finger-nose-finger and heel-to-shin.  Gait and station: Station is normal.   The gait and tandem gait are normal. Romberg is negative..   Reflexes: Deep tendon reflexes are symmetric and normal bilaterally.   Plantar responses are flexor.    DIAGNOSTIC DATA (LABS, IMAGING, TESTING) - I reviewed patient records, labs, notes, testing and imaging myself where available.  Lab Results  Component Value Date   WBC 16.6 (H) 07/19/2017   HGB 11.2 (L) 07/19/2017   HCT 33.9 (L) 07/19/2017   MCV 83.5 07/19/2017   PLT 242 07/19/2017      Component Value Date/Time   NA 140 07/18/2017 0143   NA 147 (H) 07/09/2017 1150   K 4.1 07/18/2017 0143   CL 108 07/18/2017 0143   CO2 24 07/18/2017 0143   GLUCOSE 118 (H) 07/18/2017 0143   BUN 14 07/18/2017 0143   BUN 10 07/09/2017 1150   CREATININE 0.87 07/18/2017 0143   CREATININE 0.74 02/06/2013 1559   CALCIUM 9.1 07/18/2017 0143   PROT 7.2 07/15/2017 2028   PROT 7.0 07/09/2017 1150   ALBUMIN 4.2 07/15/2017 2028   ALBUMIN 4.4 07/09/2017 1150   AST 25 07/15/2017 2028   ALT 25 07/15/2017 2028   ALKPHOS 53 07/15/2017 2028   BILITOT 0.4 07/15/2017 2028   BILITOT 0.3 07/09/2017 1150   GFRNONAA >60 07/18/2017 0143   GFRAA >60 07/18/2017 0143   No results found for: CHOL, HDL,  LDLCALC, LDLDIRECT, TRIG, CHOLHDL No results found for: HGBA1C Lab Results  Component  Value Date   VITAMINB12 279 07/26/2017   Lab Results  Component Value Date   TSH 2.218 07/16/2017       ASSESSMENT AND PLAN  Multiple sclerosis (HCC)  Facial numbness  High risk medication use  Low vitamin D level   1.    She will continue ocrelizumab. Her next dose should be in a few weeks.   She is advised to call us back next week if she does not hear about scheduling before then.  Later this year we'll check some blood work (IgG/IgM and CBC) and also check the MRI for subclinical progression. 2.   Continued headaches of vitamin D supplements  3.   She will return to see me in 6 months or sooner if there are new or worsening neurologic symptoms.  Margan Elias A. Felecia Shelling, MD, PhD, FAAN Certified in Neurology, Clinical Neurophysiology, Sleep Medicine, Pain Medicine and Neuroimaging Director, [place] [facility] [address] [place], [place] [address] [phone]

## 2018-03-10 ENCOUNTER — Encounter: Payer: Self-pay | Admitting: Neurology

## 2018-05-12 ENCOUNTER — Encounter: Payer: Self-pay | Admitting: Family Medicine

## 2018-05-17 ENCOUNTER — Encounter: Payer: Self-pay | Admitting: Family Medicine

## 2018-05-18 ENCOUNTER — Encounter: Payer: Self-pay | Admitting: Neurology

## 2018-05-18 ENCOUNTER — Encounter: Payer: Self-pay | Admitting: Emergency Medicine

## 2018-05-19 ENCOUNTER — Other Ambulatory Visit: Payer: Self-pay

## 2018-05-19 ENCOUNTER — Ambulatory Visit: Payer: BC Managed Care – PPO | Admitting: Emergency Medicine

## 2018-05-19 ENCOUNTER — Ambulatory Visit: Payer: Self-pay

## 2018-05-19 ENCOUNTER — Encounter: Payer: Self-pay | Admitting: Emergency Medicine

## 2018-05-19 VITALS — BP 112/62 | HR 95 | Temp 98.7°F | Resp 18 | Ht 65.51 in | Wt 144.6 lb

## 2018-05-19 DIAGNOSIS — R0789 Other chest pain: Secondary | ICD-10-CM

## 2018-05-19 NOTE — Patient Instructions (Addendum)
   IF you received an x-ray today, you will receive an invoice from [facility]. Please contact [facility] at [phone] with questions or concerns regarding your invoice.   IF you received labwork today, you will receive an invoice from [facility]. Please contact [facility] at [phone] with questions or concerns regarding your invoice.   Our billing staff will not be able to assist you with questions regarding bills from these companies.  You will be contacted with the lab results as soon as they are available. The fastest way to get your results is to activate your My Chart account. Instructions are located on the last page of this paperwork. If you have not heard from us regarding the results in 2 weeks, please contact this office.     Nonspecific Chest Pain Chest pain can be caused by many different conditions. There is a chance that your pain could be related to something serious, such as a heart attack or a blood clot in your lungs. Chest pain can also be caused by conditions that are not life-threatening. If you have chest pain, it is very important to follow up with your doctor. Follow these instructions at home: Medicines  If you were prescribed an antibiotic medicine, take it as told by your doctor. Do not stop taking the antibiotic even if you start to feel better.  Take over-the-counter and prescription medicines only as told by your doctor. Lifestyle  Do not use any products that contain nicotine or tobacco, such as cigarettes and e-cigarettes. If you need help quitting, ask your doctor.  Do not drink alcohol.  Make lifestyle changes as told by your doctor. These may include: ? Getting regular exercise. Ask your doctor for some activities that are safe for you. ? Eating a heart-healthy diet. A diet specialist (dietitian) can help you to learn healthy eating options. ? Staying at a healthy weight. ? Managing diabetes, if needed. ? Lowering your  stress, as with deep breathing or spending time in nature. General instructions  Avoid any activities that make you feel chest pain.  If your chest pain is because of heartburn: ? Raise (elevate) the head of your bed about 6 inches (15 cm). You can do this by putting blocks under the bed legs at the head of the bed. ? Do not sleep with extra pillows under your head. That does not help heartburn.  Keep all follow-up visits as told by your doctor. This is important. This includes any further testing if your chest pain does not go away. Contact a doctor if:  Your chest pain does not go away.  You have a rash with blisters on your chest.  You have a fever.  You have chills. Get help right away if:  Your chest pain is worse.  You have a cough that gets worse, or you cough up blood.  You have very bad (severe) pain in your belly (abdomen).  You are very weak.  You pass out (faint).  You have either of these for no clear reason: ? Sudden chest discomfort. ? Sudden discomfort in your arms, back, neck, or jaw.  You have shortness of breath at any time.  You suddenly start to sweat, or your skin gets clammy.  You feel sick to your stomach (nauseous).  You throw up (vomit).  You suddenly feel light-headed or dizzy.  Your heart starts to beat fast, or it feels like it is skipping beats. These symptoms may be an emergency. Do not wait   to see if the symptoms will go away. Get medical help right away. Call your local emergency services (911 in the [place] Do not drive yourself to the hospital. This information is not intended to replace advice given to you by your health care provider. Make sure you discuss any questions you have with your health care provider. Document Released: 05/31/2008 Document Revised: 09/06/2016 Document Reviewed: 09/06/2016 Elsevier Interactive Patient Education  2017 [employer]  

## 2018-05-19 NOTE — Progress Notes (Signed)
Michele Lane 32 y.o.   Chief Complaint  Patient presents with  . Chest Pain    Episodes of chest pain since November. No pain today    HISTORY OF PRESENT ILLNESS: This is a 32 y.o. female complaining of intermittent short-lived episodes of sharp midsternal chest pain lasting seconds, no more than 1 minute, since last November.  Patient is an active Armed forces operational officer.  Denies chest pain on exertion.  Asymptomatic episodes.  Denies syncope, lightheadedness, diaphoresis, difficulty breathing, nausea or vomiting, or any other associated symptoms.  Has a history of palpitations and premature beats.  Had a completely normal echocardiogram done last July.  Results reviewed with patient today.  Patient has a history of MS.  Has been doing well.  HPI   Prior to Admission medications   Medication Sig Start Date End Date Taking? Authorizing Provider  butalbital-acetaminophen-caffeine [facility]) 50-325-40 MG tablet Take 1 tablet by mouth every 6 (six) hours as needed for headache. 07/15/17  Yes Monaca Wadas, Eilleen Kempf, MD  levonorgestrel-ethinyl estradiol (SEASONALE,INTROVALE,JOLESSA) 0.15-0.03 MG tablet Take 1 tablet by mouth daily. 11/28/15  Yes [provider]  diazepam (VALIUM) 5 MG tablet Take 1 tablet (5 mg total) by mouth 2 (two) times daily. As needed. Patient not taking: Reported on 05/19/2018 07/15/17   Georgina Quint, MD  doxepin [facility]) 10 MG/ML solution Take 1 mL (10 mg total) by mouth at bedtime. Patient not taking: Reported on 05/19/2018 02/22/18   Asa Lente, MD  gabapentin (NEURONTIN) 600 MG tablet Take 0.5 tablets (300 mg total) by mouth at bedtime. Patient not taking: Reported on 05/19/2018 07/19/17   Alison Murray, MD  pantoprazole (PROTONIX) 40 MG tablet Take 1 tablet (40 mg total) by mouth daily at 12 noon. Patient not taking: Reported on 05/19/2018 07/19/17   Alison Murray, MD    No Known Allergies  Patient Active Problem List   Diagnosis Date Noted  . 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 Low vitamin D level 07/26/2017  . High risk medication use 07/26/2017  . Multiple sclerosis (HCC) 07/16/2017  . Bradycardia 07/16/2017  . Nausea and vomiting 07/15/2017  . Facial numbness 07/09/2017  . Intractable headache 07/09/2017  . Dizziness 07/09/2017  . General weakness 07/09/2017  . 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 History of hypoglycemia 02/06/2013  . Uses oral contraception 02/06/2013    Past Medical History:  Diagnosis Date  . Bradycardia   . Hypoglycemia   . Hypoglycemia   . Migraine     Past Surgical History:  Procedure Laterality Date  . WISDOM TOOTH EXTRACTION      Social History   Socioeconomic History  . Marital status: Single    Spouse name: Not on file  . Number of children: 0  . Years of education: Not on file  . Highest education level: Not on file  Occupational History  . Not on file  Social Needs  . Financial resource strain: Not on file  . Food insecurity:    Worry: Not on file    Inability: Not on file  . Transportation needs:    Medical: Not on file    Non-medical: Not on file  Tobacco Use  . Smoking status: Never Smoker  . Smokeless tobacco: Never Used  Substance and Sexual Activity  . Alcohol use: Yes    Alcohol/week: 0.6 oz    Types: 1 Glasses of wine per week  . Drug use: No  . Sexual activity: Yes    Birth control/protection: Pill  Lifestyle  . Physical activity:    Days per week: Not on file    Minutes per session: Not on file  . Stress: Not on file  Relationships  . Social connections:    Talks on phone: Not on file    Gets together: Not on file    Attends religious service: Not on file    Active member of club or organization: Not on file    Attends meetings of clubs or organizations: Not on file    Relationship status: Not on file  . Intimate partner violence:    Fear of current or ex partner: Not on file    Emotionally abused: Not on file    Physically abused: Not on file    Forced sexual  activity: Not on file  Other Topics Concern  . Not on file  Social History Narrative   [employer] - class scheduler    Family History  Problem Relation Age of Onset  . Neuropathy Mother   . Kidney cancer Father   . Diabetes Maternal Grandfather   . Heart block Paternal Grandfather        Required Pacemaker placement in his 31's     Review of Systems  Constitutional: Negative.  Negative for chills, fever and weight loss.  HENT: Negative.  Negative for sore throat.   Eyes: Negative.  Negative for blurred vision and double vision.  Respiratory: Negative.  Negative for cough, shortness of breath and wheezing.   Cardiovascular: Positive for chest pain and palpitations. Negative for leg swelling.  Gastrointestinal: Negative.  Negative for abdominal pain, diarrhea, nausea and vomiting.  Genitourinary: Negative.  Negative for dysuria and hematuria.  Musculoskeletal: Negative.  Negative for myalgias and neck pain.  Skin: Negative.  Negative for rash.  Neurological: Negative.  Negative for dizziness, seizures and headaches.  Endo/Heme/Allergies: Negative.   All other systems reviewed and are negative.  Vitals:   05/19/18 1516  BP: 112/62  Pulse: 95  Resp: 18  Temp: 98.7 F (37.1 C)  SpO2: 98%     Physical Exam  Constitutional: She is oriented to person, place, and time. She appears well-developed and well-nourished.  HENT:  Head: Normocephalic and atraumatic.  Nose: Nose normal.  Mouth/Throat: Oropharynx is clear and moist.  Eyes: Pupils are equal, round, and reactive to light. Conjunctivae and EOM are normal.  Neck: Normal range of motion. Neck supple. No JVD present.  Cardiovascular: Normal rate, regular rhythm and normal heart sounds.  Pulmonary/Chest: Effort normal and breath sounds normal. No respiratory distress.  Musculoskeletal: Normal range of motion. She exhibits no edema.  Lymphadenopathy:    She has no cervical adenopathy.  Neurological: She is alert and oriented to  person, place, and time. No sensory deficit. She exhibits normal muscle tone.  Skin: Skin is warm and dry. Capillary refill takes less than 2 seconds. No rash noted.  Psychiatric: She has a normal mood and affect. Her behavior is normal.  Vitals reviewed.    ASSESSMENT & PLAN: Michele Lane was seen today for chest pain.  Diagnoses and all orders for this visit:  Atypical chest pain    Patient Instructions       IF you received an x-ray today, you will receive an invoice from [facility]. Please contact [facility] at [phone] with questions or concerns regarding your invoice.   IF you received labwork today, you will receive an invoice from [facility]. Please contact [facility] at [phone] with questions or concerns regarding your invoice.  Our billing staff will not be able to assist you with questions regarding bills from these companies.  You will be contacted with the lab results as soon as they are available. The fastest way to get your results is to activate your My Chart account. Instructions are located on the last page of this paperwork. If you have not heard from [place] regarding the results in 2 weeks, please contact this office.     Nonspecific Chest Pain Chest pain can be caused by many different conditions. There is a chance that your pain could be related to something serious, such as a heart attack or a blood clot in your lungs. Chest pain can also be caused by conditions that are not life-threatening. If you have chest pain, it is very important to follow up with your doctor. Follow these instructions at home: Medicines  If you were prescribed an antibiotic medicine, take it as told by your doctor. Do not stop taking the antibiotic even if you start to feel better.  Take over-the-counter and prescription medicines only as told by your doctor. Lifestyle  Do not use any products that contain nicotine or tobacco, such as cigarettes and  e-cigarettes. If you need help quitting, ask your doctor.  Do not drink alcohol.  Make lifestyle changes as told by your doctor. These may include: ? Getting regular exercise. Ask your doctor for some activities that are safe for you. ? Eating a heart-healthy diet. A diet specialist (dietitian) can help you to learn healthy eating options. ? Staying at a healthy weight. ? Managing diabetes, if needed. ? Lowering your stress, as with deep breathing or spending time in nature. General instructions  Avoid any activities that make you feel chest pain.  If your chest pain is because of heartburn: ? Raise (elevate) the head of your bed about 6 inches (15 cm). You can do this by putting blocks under the bed legs at the head of the bed. ? Do not sleep with extra pillows under your head. That does not help heartburn.  Keep all follow-up visits as told by your doctor. This is important. This includes any further testing if your chest pain does not go away. Contact a doctor if:  Your chest pain does not go away.  You have a rash with blisters on your chest.  You have a fever.  You have chills. Get help right away if:  Your chest pain is worse.  You have a cough that gets worse, or you cough up blood.  You have very bad (severe) pain in your belly (abdomen).  You are very weak.  You pass out (faint).  You have either of these for no clear reason: ? Sudden chest discomfort. ? Sudden discomfort in your arms, back, neck, or jaw.  You have shortness of breath at any time.  You suddenly start to sweat, or your skin gets clammy.  You feel sick to your stomach (nauseous).  You throw up (vomit).  You suddenly feel light-headed or dizzy.  Your heart starts to beat fast, or it feels like it is skipping beats. These symptoms may be an emergency. Do not wait to see if the symptoms will go away. Get medical help right away. Call your local emergency services (911 in the [place] Do not  drive yourself to the hospital. This information is not intended to replace advice given to you by your health care provider. Make sure you discuss any questions you have with your health care  provider. Document Released: 05/31/2008 Document Revised: 09/06/2016 Document Reviewed: 09/06/2016 Elsevier Interactive Patient Education  2017 Elsevier Inc.      Edwina Barth, MD [facility]

## 2018-05-19 NOTE — Telephone Encounter (Signed)
Pt. E-mailed provider yesterday and was instructed to make appointment for today or tomorrow. Reports since last November she has episodes of sharp chest pain with movement that last < 5 seconds. No radiation or other symptoms associated with this. States she has also had periods of heart rate in the 30's. No chest pain today. Appointment made for today with her provider. Reports recent MS diagnosis.  Reason for Disposition . [1] Chest pain lasting <= 5 minutes AND [2] NO chest pain or cardiac symptoms now(Exceptions: pains lasting a few seconds)  Answer Assessment - Initial Assessment Questions 1. LOCATION: "Where does it hurt?"       Center of chest 2. RADIATION: "Does the pain go anywhere else?" (e.g., into neck, jaw, arms, back)     No 3. ONSET: "When did the chest pain begin?" (Minutes, hours or days)      Started last November 4. PATTERN "Does the pain come and go, or has it been constant since it started?"  "Does it get worse with exertion?"      Comes and goes 5. DURATION: "How long does it last" (e.g., seconds, minutes, hours)     A few seconds 6. SEVERITY: "How bad is the pain?"  (e.g., Scale 1-10; mild, moderate, or severe)    - MILD (1-3): doesn't interfere with normal activities     - MODERATE (4-7): interferes with normal activities or awakens from sleep    - SEVERE (8-10): excruciating pain, unable to do any normal activities       3 7. CARDIAC RISK FACTORS: "Do you have any history of heart problems or risk factors for heart disease?" (e.g., prior heart attack, angina; high blood pressure, diabetes, being overweight, high cholesterol, smoking, or strong family history of heart disease)     No 8. PULMONARY RISK FACTORS: "Do you have any history of lung disease?"  (e.g., blood clots in lung, asthma, emphysema, birth control pills)     No 9. CAUSE: "What do you think is causing the chest pain?"     Unsure 10. OTHER SYMPTOMS: "Do you have any other symptoms?" (e.g., dizziness,  nausea, vomiting, sweating, fever, difficulty breathing, cough)       No 11. PREGNANCY: "Is there any chance you are pregnant?" "When was your last menstrual period?"       No  Protocols used: CHEST PAIN-A-AH

## 2018-08-04 ENCOUNTER — Ambulatory Visit: Payer: BC Managed Care – PPO | Admitting: Emergency Medicine

## 2018-08-08 ENCOUNTER — Telehealth: Payer: Self-pay | Admitting: Neurology

## 2018-08-08 DIAGNOSIS — G35 Multiple sclerosis: Secondary | ICD-10-CM

## 2018-08-08 NOTE — Telephone Encounter (Signed)
Pt called stating it is time for her yearly MRI. Requesting Dr. Epimenio Foot put in an order to get one scheduled.

## 2018-08-08 NOTE — Telephone Encounter (Signed)
Per last ov note, MRI brain to be checked this yr. MRI brian with and without contrast ordered/fim

## 2018-08-08 NOTE — Telephone Encounter (Signed)
lvm for pt to call back about scheduling mri  [employer] Auth: [id number] (exp. 08/08/18 to 09/06/18)

## 2018-08-21 NOTE — Telephone Encounter (Signed)
Patient returning a call to schedule an MRI.

## 2018-08-21 NOTE — Telephone Encounter (Signed)
Spoke to patient she is scheduled for 08/22/18 at [facility].  °

## 2018-08-22 ENCOUNTER — Ambulatory Visit: Payer: BC Managed Care – PPO

## 2018-08-22 DIAGNOSIS — G35 Multiple sclerosis: Secondary | ICD-10-CM

## 2018-08-22 MED ORDER — GADOPENTETATE DIMEGLUMINE 469.01 MG/ML IV SOLN
13.0000 mL | Freq: Once | INTRAVENOUS | Status: AC | PRN
  Administered 2018-08-22: 13 mL via INTRAVENOUS

## 2018-08-23 ENCOUNTER — Telehealth: Payer: Self-pay | Admitting: *Deleted

## 2018-08-23 ENCOUNTER — Ambulatory Visit: Payer: BC Managed Care – PPO | Admitting: Neurology

## 2018-08-23 NOTE — Telephone Encounter (Signed)
-----   Message from Asa Lente, MD sent at 08/23/2018 11:54 AM EDT ----- Please let her know that the MRI does not show any new lesions.

## 2018-08-23 NOTE — Telephone Encounter (Signed)
LMOM with below MRI results. She does not need to return this call unless she has questions/fim 

## 2018-09-12 ENCOUNTER — Encounter: Payer: Self-pay | Admitting: Emergency Medicine

## 2018-09-14 ENCOUNTER — Encounter: Payer: Self-pay | Admitting: *Deleted

## 2018-10-11 ENCOUNTER — Encounter: Payer: Self-pay | Admitting: Neurology

## 2018-10-11 ENCOUNTER — Ambulatory Visit: Payer: BC Managed Care – PPO | Admitting: Neurology

## 2018-10-11 VITALS — BP 118/60 | HR 60 | Ht 65.5 in | Wt 146.0 lb

## 2018-10-11 DIAGNOSIS — R7989 Other specified abnormal findings of blood chemistry: Secondary | ICD-10-CM

## 2018-10-11 DIAGNOSIS — Z79899 Other long term (current) drug therapy: Secondary | ICD-10-CM

## 2018-10-11 DIAGNOSIS — R200 Anesthesia of skin: Secondary | ICD-10-CM

## 2018-10-11 DIAGNOSIS — G35 Multiple sclerosis: Secondary | ICD-10-CM

## 2018-10-11 DIAGNOSIS — G4700 Insomnia, unspecified: Secondary | ICD-10-CM

## 2018-10-11 NOTE — Progress Notes (Signed)
[facility]  PATIENT: Michele Lane DOB: 04-20-1986  REFERRING DOCTOR OR PCP:  Keitha Butte SOURCE: patient, family, PCP notes, radiology/lab results, ED/hospital notes, MRI images on PACS  _________________________________   HISTORICAL   HISTORY OF PRESENT ILLNESS:   Michele Lane is a 32 y.o. woman with multiple sclerosis.  Update 10/11/2018: She just had her second full dose of Ocrevus this week.   She has no exacerbation or new symptoms.   She had a left middle cerebellar peduncle enhancing lesion and 7 other enhancing cerebral hemisphere lesions at diagnosis as well as other non-enhancing (including 2 in cervical spine).   Gait is doing well ans she can climb stairs without issues.   The left facial numbness is still intermittent though the rest of the numbness and all ataxia resolved.     Bladder is fine.   She had diplopia for a couple weeks at diagnosis but none since and no optic neuritis symptoms.    Fatigue is good.     She has insomnia, sleep maintenance insomnia.  Melatonin helps the sleep onset.        Update 02/22/2018: She feels her MS is stable. She had  her first dose of ocrelizumab in September and tolerated it well. She has not had any exacerbations. Her gait returned close to baseline. She does not note any numbness or weakness. The facial numbness resolved for the most part but she will every now and then note mild numbness on the inside of her mouth. She has no difficulty with vision or bladder.  Fatigue is a little low.    If she has a long week at work, she feels more tured but she is still doing everything she needs to.    She has sleep maintenance insomnia.   This is a long term issue that predated the MS.   Gabapentin did not help her sleep,   She denies nay problems with mood.  Cognition is doing well  From 07/26/2017: On July 6th, 2018, she woke up with left lip numbness that progressed to a larger area as the day went on.   The next  morning she was clumsy when she noted her walking was more off balanced.     She also had a headache but it was different than her typical migraine.   Two days later, she went to the ED.   Her pulse was in the 30's and they focused on that. A CT was done and she was told it was normal.   She was given a 'migraine cocktail'.  The HA was mildly better until 2 days later when she woke up and had poor balance and extreme nausea and vomiting.    She went back to the ED.    She saw Dr. Mitchel Honour 07/09/17 and he ordered an MRI that was performed 07/15/17.   It was c/w MS an she was told to go to the ED.    She was admitted admitted and received 5 days of IV Solu-Medrol.   She improved that week and continues to improve.  Currently, she denies any difficulties with her gait. There is no weakness in the arms or legs or face. She does not note any numbness in the arms or legs but she continues to have mild facial numbness on the left. This improved quite a bit from the intensity of couple weeks ago.  Bladder function is fine. Vision is normal. She denies any diplopia or eye pain.  She  denies any difficulties with depression or anxiety. She has not noted any cognitive difficulties. While in the hospital she felt tired but she feels back to her baseline now. She remains active we discussed that she is fine to resume normal activities like exercise and tennis. She sleeps well.   I personally reviewed the MRIs of the brain from 07/15/2017 and the cervical and thoracic spine from 07/17/2017. The MRI of the brain shows an enhancing lesion in the left middle cerebellar peduncle. Additionally, there are many other foci in the periventricular, juxtacortical and deep white matter in a configuration consistent with multiple sclerosis. Seven of the hemispheric lesions also enhanced.   MRI of the cervical spine shows a focus at C3-C4 to the right. It does not enhance.  There is also a right paramedian disc herniation at C5-C6 and  anterior thecal sac.    The MRI of the thoracic spine is normal.   I reviewed left. Tests from the hospital and as an outpatient earlier. The ANA, Lyme, ESR' \and'  HIV were negative or normal.  His second neurological problem is episodic migraine headaches. She averages 4-8 a month. They tend to cluster once or twice a month with 3 or 4 days in a row. They're not associated with her menstrual cycle. Typically she will get a visual aura though the occasional migraine occurs with mild vision changes without aura.  The pain is throbbing and usually unilateral when it occurs. If she takes a Fioricet during the aura, her headaches tend to be very short order.. Fioricet works almost every time. She has never been on a prophylactic agent. She has never been on a triptan.   REVIEW OF SYSTEMS: Constitutional: No fevers, chills, sweats, or change in appetite.   She has insomnia and mild restless leg syndrome Eyes: No visual changes, double vision, eye pain Ear, nose and throat: No hearing loss, ear pain, nasal congestion, sore throat Cardiovascular: No chest pain, palpitations Respiratory: No shortness of breath at rest or with exertion.  No wheezes.  No snoring GastrointestinaI: No nausea, vomiting, diarrhea, abdominal pain, fecal incontinence Genitourinary: No dysuria, urinary retention or frequency.  No nocturia. Musculoskeletal: No neck pain, back pain Integumentary: No rash, pruritus, skin lesions Neurological: as above Psychiatric: No depression at this time.  No anxiety Endocrine: No palpitations, diaphoresis, change in appetite, change in weigh or increased thirst Hematologic/Lymphatic: No anemia, purpura, petechiae. Allergic/Immunologic: No itchy/runny eyes, nasal congestion, recent allergic reactions, rashes  ALLERGIES: No Known Allergies  HOME MEDICATIONS:  Current Outpatient Medications:  .  butalbital-acetaminophen-caffeine (ESGIC) 50-325-40 MG tablet, Take 1 tablet by mouth every 6  (six) hours as needed for headache., Disp: 30 tablet, Rfl: 0 .  levonorgestrel-ethinyl estradiol (SEASONALE,INTROVALE,JOLESSA) 0.15-0.03 MG tablet, Take 1 tablet by mouth daily., Disp: , Rfl: 0 .  ocrelizumab (OCREVUS) 300 MG/10ML injection, Ocrevus 30 mg/mL intravenous solution  Inject by intravenous route., Disp: , Rfl:   PAST MEDICAL HISTORY: Past Medical History:  Diagnosis Date  . Bradycardia   . Hypoglycemia   . Hypoglycemia   . Migraine     PAST SURGICAL HISTORY: Past Surgical History:  Procedure Laterality Date  . WISDOM TOOTH EXTRACTION      FAMILY HISTORY: Family History  Problem Relation Age of Onset  . Neuropathy Mother   . Kidney cancer Father   . Diabetes Maternal Grandfather   . Heart block Paternal Grandfather        Required Pacemaker placement in his 84's    SOCIAL HISTORY:  Social History   Socioeconomic History  . Marital status: Single    Spouse name: Not on file  . Number of children: 0  . Years of education: Not on file  . Highest education level: Not on file  Occupational History  . Not on file  Social Needs  . Financial resource strain: Not on file  . Food insecurity:    Worry: Not on file    Inability: Not on file  . Transportation needs:    Medical: Not on file    Non-medical: Not on file  Tobacco Use  . Smoking status: Never Smoker  . Smokeless tobacco: Never Used  Substance and Sexual Activity  . Alcohol use: Yes    Alcohol/week: 1.0 standard drinks    Types: 1 Glasses of wine per week  . Drug use: No  . Sexual activity: Yes    Birth control/protection: Pill  Lifestyle  . Physical activity:    Days per week: Not on file    Minutes per session: Not on file  . Stress: Not on file  Relationships  . Social connections:    Talks on phone: Not on file    Gets together: Not on file    Attends religious service: Not on file    Active member of club or organization: Not on file    Attends meetings of clubs or organizations: Not  on file    Relationship status: Not on file  . Intimate partner violence:    Fear of current or ex partner: Not on file    Emotionally abused: Not on file    Physically abused: Not on file    Forced sexual activity: Not on file  Other Topics Concern  . Not on file  Social History Narrative   [employer] - class scheduler     PHYSICAL EXAM  Vitals:   10/11/18 1506  BP: 118/60  Pulse: 60  Weight: 146 lb (66.2 kg)  Height: 5' 5.5" (1.664 m)    Body mass index is 23.93 kg/m.   General: The patient is well-developed and well-nourished and in no acute distress   Neurologic Exam  Mental status: The patient is alert and oriented x 3 at the time of the examination. The patient has apparent normal recent and remote memory, with an apparently normal attention span and concentration ability.   Speech is normal.  Cranial nerves: Extraocular movements are full.  Facial strength and sensation is normal.  Trapezius strength is normal.  The tongue is midline, and the patient has symmetric elevation of the soft palate. No obvious hearing deficits are noted.  Motor:  Muscle bulk is normal.   Tone is normal. Strength is  5 / 5 in all 4 extremities.   Sensory: Sensory testing is intact to pinprick, soft touch and vibration sensation in all 4 extremities.  Coordination: Finger-nose-finger and heel-to-shin is performed well today.  Gait and station: Station is normal.   The gait and the tandem gait are normal.  Romberg sign is negative. Reflexes: Deep tendon reflexes are symmetric and normal bilaterally.      DIAGNOSTIC DATA (LABS, IMAGING, TESTING) - I reviewed patient records, labs, notes, testing and imaging myself where available.  Lab Results  Component Value Date   WBC 16.6 (H) 07/19/2017   HGB 11.2 (L) 07/19/2017   HCT 33.9 (L) 07/19/2017   MCV 83.5 07/19/2017   PLT 242 07/19/2017      Component Value Date/Time   NA 140 07/18/2017 0143  NA 147 (H) 07/09/2017 1150   K 4.1  07/18/2017 0143   CL 108 07/18/2017 0143   CO2 24 07/18/2017 0143   GLUCOSE 118 (H) 07/18/2017 0143   BUN 14 07/18/2017 0143   BUN 10 07/09/2017 1150   CREATININE 0.87 07/18/2017 0143   CREATININE 0.74 02/06/2013 1559   CALCIUM 9.1 07/18/2017 0143   PROT 7.2 07/15/2017 2028   PROT 7.0 07/09/2017 1150   ALBUMIN 4.2 07/15/2017 2028   ALBUMIN 4.4 07/09/2017 1150   AST 25 07/15/2017 2028   ALT 25 07/15/2017 2028   ALKPHOS 53 07/15/2017 2028   BILITOT 0.4 07/15/2017 2028   BILITOT 0.3 07/09/2017 1150   GFRNONAA >60 07/18/2017 0143   GFRAA >60 07/18/2017 0143   No results found for: CHOL, HDL, LDLCALC, LDLDIRECT, TRIG, CHOLHDL No results found for: HGBA1C Lab Results  Component Value Date   VITAMINB12 279 07/26/2017   Lab Results  Component Value Date   TSH 2.218 07/16/2017       ASSESSMENT AND PLAN  Multiple sclerosis ([place]) - Plan: CBC with Differential/Platelet, IgG, IgA, IgM  Facial numbness  Low vitamin D level - Plan: VITAMIN D 25 Hydroxy (Vit-D Deficiency, Fractures)  High risk medication use - Plan: VITAMIN D 25 Hydroxy (Vit-D Deficiency, Fractures), CBC with Differential/Platelet, IgG, IgA, IgM  Insomnia, unspecified type   1.    She will continue ocrelizumab.   Check IgG/IgM and CBC and Vit D .   2.   Continue to take OTC 5000 U vitamin D supplements  3.    Insomnia continues to be an issue for her.  Try lower dose of doxepin for a few night.  If not better then try  flexeril 5 mg and if that fails trazdadone or a benzo.    4.   She will return to see me in 6 months or sooner if there are new or worsening neurologic symptoms.  Richard A. Felecia Shelling, MD, PhD, FAAN Certified in Neurology, Clinical Neurophysiology, Sleep Medicine, Pain Medicine and Neuroimaging Director, [place] [facility] [address] [place], [place] [address] [phone]

## 2018-10-12 LAB — CBC WITH DIFFERENTIAL/PLATELET
Basophils Absolute: 0 10*3/uL (ref 0.0–0.2)
Basos: 0 %
EOS (ABSOLUTE): 0 10*3/uL (ref 0.0–0.4)
Eos: 0 %
HEMOGLOBIN: 12.4 g/dL (ref 11.1–15.9)
Hematocrit: 36.9 % (ref 34.0–46.6)
Immature Grans (Abs): 0.1 10*3/uL (ref 0.0–0.1)
Immature Granulocytes: 1 %
LYMPHS ABS: 1.8 10*3/uL (ref 0.7–3.1)
Lymphs: 9 %
MCH: 28.1 pg (ref 26.6–33.0)
MCHC: 33.6 g/dL (ref 31.5–35.7)
MCV: 84 fL (ref 79–97)
MONOCYTES: 7 %
Monocytes Absolute: 1.2 10*3/uL — ABNORMAL HIGH (ref 0.1–0.9)
NEUTROS ABS: 15.9 10*3/uL — AB (ref 1.4–7.0)
Neutrophils: 83 %
Platelets: 316 10*3/uL (ref 150–450)
RBC: 4.41 x10E6/uL (ref 3.77–5.28)
RDW: 12.9 % (ref 12.3–15.4)
WBC: 19.2 10*3/uL — ABNORMAL HIGH (ref 3.4–10.8)

## 2018-10-12 LAB — IGG, IGA, IGM
IGG (IMMUNOGLOBIN G), SERUM: 1007 mg/dL (ref 700–1600)
IgA/Immunoglobulin A, Serum: 105 mg/dL (ref 87–352)
IgM (Immunoglobulin M), Srm: 122 mg/dL (ref 26–217)

## 2018-10-12 LAB — VITAMIN D 25 HYDROXY (VIT D DEFICIENCY, FRACTURES): Vit D, 25-Hydroxy: 93.9 ng/mL (ref 30.0–100.0)

## 2018-10-13 ENCOUNTER — Telehealth: Payer: Self-pay | Admitting: *Deleted

## 2018-10-13 NOTE — Telephone Encounter (Signed)
LVm informing patient that her labs showed her Vitamin D and IgG/IgM were good. Advised she continue over-the-counter vitamin D. Informed her that her white blood cell count is elevated. Inquired if she has any urinary tract infection symptoms?  Requested she call next Monday to discuss as our office is now closed. Left office number.

## 2019-03-23 ENCOUNTER — Telehealth: Payer: Self-pay | Admitting: *Deleted

## 2019-03-23 NOTE — Telephone Encounter (Signed)
Received fax for request for services. Prior review fax form.  Fax confirmation received after signed by Dr. Epimenio Foot [phone] for Drugs (ocrevus 600mg  IV every 6 months).

## 2019-04-10 ENCOUNTER — Telehealth: Payer: Self-pay | Admitting: *Deleted

## 2019-04-10 NOTE — Telephone Encounter (Signed)
I called pt. Updated medication list, pharmacy and allergy list on file for virtual visit with Dr. Epimenio Foot on 04/12/19.   Has Ocrevus infusion scheduled for tomorrow morning here at [facility]. No new sx, doing well and tolerating infusions well.   I did relay that Ocrevus suppresses the immune system. She should self isolate at home d/t concerns with Covid-19.

## 2019-04-12 ENCOUNTER — Ambulatory Visit (INDEPENDENT_AMBULATORY_CARE_PROVIDER_SITE_OTHER): Payer: BC Managed Care – PPO | Admitting: Neurology

## 2019-04-12 ENCOUNTER — Encounter: Payer: Self-pay | Admitting: Neurology

## 2019-04-12 ENCOUNTER — Other Ambulatory Visit: Payer: Self-pay

## 2019-04-12 DIAGNOSIS — G35 Multiple sclerosis: Secondary | ICD-10-CM | POA: Diagnosis not present

## 2019-04-12 DIAGNOSIS — R200 Anesthesia of skin: Secondary | ICD-10-CM | POA: Diagnosis not present

## 2019-04-12 DIAGNOSIS — G4700 Insomnia, unspecified: Secondary | ICD-10-CM

## 2019-04-12 DIAGNOSIS — G43009 Migraine without aura, not intractable, without status migrainosus: Secondary | ICD-10-CM | POA: Diagnosis not present

## 2019-04-12 MED ORDER — BUTALBITAL-APAP-CAFFEINE 50-325-40 MG PO TABS: ORAL_TABLET | ORAL | 3 refills | Status: AC

## 2019-04-12 NOTE — Progress Notes (Addendum)
[facility]  PATIENT: Michele Lane DOB: 15-Feb-1986  REFERRING DOCTOR OR PCP:  Keitha Butte SOURCE: patient, family, PCP notes, radiology/lab results, ED/hospital notes, MRI images on PACS  _________________________________   HISTORICAL   HISTORY OF PRESENT ILLNESS:   Michele Lane is a 33 y.o. woman with relapsing remitting multiple sclerosis.  Update 04/12/2019: Virtual Visit via Video Note I connected with Michele Lane  on 04/12/19 at  4:00 PM EDT by a video enabled telemedicine application and verified that I am speaking with the correct person.  I discussed the limitations of evaluation and management by telemedicine and the availability of in person appointments. The patient expressed understanding and agreed to proceed.  History of Present Illness: She just had her second full dose of Ocrevus earlier this week.   She has no exacerbation or new symptoms.   She had a left middle cerebellar peduncle enhancing lesion and 7 other enhancing cerebral hemisphere lesions at diagnosis as well as other non-enhancing (including 2 in cervical spine).   Gait is doing well ans she can climb stairs without issues.   The left facial numbness is still intermittent though the rest of the numbness and all ataxia resolved.     Bladder is fine.   She had diplopia for a couple weeks at diagnosis but none since and no optic neuritis symptoms.    Fatigue is good.     She has insomnia, sleep maintenance insomnia.  Melatonin helps the sleep onset.     She has migraine headaches couple times a month.  For more severe migraine she takes [employer].  She uses only about a dozen pills a year.   She recently got married.  She is contemplating becoming pregnant in the next few months.  She and her husband may also be moving to [facility] soon.    Observations/Objective: She is a well-developed well-nourished woman in no acute distress.  The head is normocephalic and atraumatic.  The neck has  a good range of motion.  Sclera are anicteric.  Visible skin appears normal.  She is alert and fully oriented with fluent speech and good attention, knowledge and memory.  Extraocular muscles are intact.  Facial strength is normal.  Palatal elevation and tongue protrusion of midline.  Trapezius strength is normal.  Hearing appears normal.  She appears to have normal strength in the hands.  Rapid alternating movements and finger-nose-finger are performed well.  Assessment and Plan: Multiple sclerosis (HCC)  Atypical migraine  Facial numbness  Insomnia, unspecified type  1.    She has done very well on Ocrevus for her relapsing remitting MS and has not had any exacerbations since starting in 2018.  IgG/IgM/IgA and CBC with differential and vitamin D were normal when tested 6 months ago.  We will check an MRI of the brain and cervical spine without contrast in a month or so 2.  He had a long conversation about pregnancy and MS focusing on the disease modifying therapies.  She ha had an aggressive presentation with an enhancing middle cerebellar peduncle lesion and 2 nonenhancing lesions in her spinal cord and several enhancing lesions in the hemispheres.  Therefore, I would not suggest that she go uncovered during pregnancy.  Generally we prefer people to be on the safest medications while pregnant if medication is needed.  Copaxone is likely the safest for the developing fetus though is likely significantly less effective than the Ocrevus for her MS.  Rituxan, is very similar to [employer]  and there is a large pregnancy database.  I reviewed an article from 2011 which included patients with autoimmune disorders as well as non-Hodgkin's lymphoma (who were on multiple medications).  The main concern appears to be hematologic with some babies being born with lymphopenia or B-cell depletion and some with platelet count derangement.  I am requesting some additional information from [place] regarding the [place]  pregnancy database which would be more specific for MS.  If she goes off therapy, rapid resumption after delivery is recommended. 3.    She may be moving to [facility] soon.  There is an MS center at at [facility] run by Dr. Donnal Debar.  I would be happy to refer her there or other centers of her choosing. 4.    [facility] for migraine she will return to see me as needed.  Follow Up Instructions: I discussed the assessment and treatment plan with the patient. The patient was provided an opportunity to ask questions and all were answered. The patient agreed with the plan and demonstrated an understanding of the instructions.    The patient was advised to call back or seek an in-person evaluation if the symptoms worsen or if the condition fails to improve as anticipated.  I provided 40 minutes of non-face-to-face time during this encounter.   Update 02/22/2018: She feels her MS is stable. She had  her first dose of ocrelizumab in September and tolerated it well. She has not had any exacerbations. Her gait returned close to baseline. She does not note any numbness or weakness. The facial numbness resolved for the most part but she will every now and then note mild numbness on the inside of her mouth. She has no difficulty with vision or bladder.  Fatigue is a little low.    If she has a long week at work, she feels more tured but she is still doing everything she needs to.    She has sleep maintenance insomnia.   This is a long term issue that predated the MS.   Gabapentin did not help her sleep,   She denies nay problems with mood.  Cognition is doing well  From 07/26/2017: On July 6th, 2018, she woke up with left lip numbness that progressed to a larger area as the day went on.   The next morning she was clumsy when she noted her walking was more off balanced.     She also had a headache but it was different than her typical migraine.   Two days later, she went to the ED.   Her pulse was in the 30's  and they focused on that. A CT was done and she was told it was normal.   She was given a 'migraine cocktail'.  The HA was mildly Lane until 2 days later when she woke up and had poor balance and extreme nausea and vomiting.    She went back to the ED.    She saw Dr. Mitchel Honour 07/09/17 and he ordered an MRI that was performed 07/15/17.   It was c/w MS an she was told to go to the ED.    She was admitted admitted and received 5 days of IV Solu-Medrol.   She improved that week and continues to improve.  Currently, she denies any difficulties with her gait. There is no weakness in the arms or legs or face. She does not note any numbness in the arms or legs but she continues to have mild facial numbness  on the left. This improved quite a bit from the intensity of couple weeks ago.  Bladder function is fine. Vision is normal. She denies any diplopia or eye pain.  She denies any difficulties with depression or anxiety. She has not noted any cognitive difficulties. While in the hospital she felt tired but she feels back to her baseline now. She remains active we discussed that she is fine to resume normal activities like exercise and tennis. She sleeps well.   I personally reviewed the MRIs of the brain from 07/15/2017 and the cervical and thoracic spine from 07/17/2017. The MRI of the brain shows an enhancing lesion in the left middle cerebellar peduncle. Additionally, there are many other foci in the periventricular, juxtacortical and deep white matter in a configuration consistent with multiple sclerosis. Seven of the hemispheric lesions also enhanced.   MRI of the cervical spine shows a focus at C3-C4 to the right. It does not enhance.  There is also a right paramedian disc herniation at C5-C6 and anterior thecal sac.    The MRI of the thoracic spine is normal.   I reviewed left. Tests from the hospital and as an outpatient earlier. The ANA, Lyme, ESR \and HIV were negative or normal.  His second neurological  problem is episodic migraine headaches. She averages 4-8 a month. They tend to cluster once or twice a month with 3 or 4 days in a row. They're not associated with her menstrual cycle. Typically she will get a visual aura though the occasional migraine occurs with mild vision changes without aura.  The pain is throbbing and usually unilateral when it occurs. If she takes a Fioricet during the aura, her headaches tend to be very short order.. Fioricet works almost every time. She has never been on a prophylactic agent. She has never been on a triptan.   REVIEW OF SYSTEMS: Constitutional: No fevers, chills, sweats, or change in appetite.   She has insomnia and mild restless leg syndrome Eyes: No visual changes, double vision, eye pain Ear, nose and throat: No hearing loss, ear pain, nasal congestion, sore throat Cardiovascular: No chest pain, palpitations Respiratory: No shortness of breath at rest or with exertion.  No wheezes.  No snoring GastrointestinaI: No nausea, vomiting, diarrhea, abdominal pain, fecal incontinence Genitourinary: No dysuria, urinary retention or frequency.  No nocturia. Musculoskeletal: No neck pain, back pain Integumentary: No rash, pruritus, skin lesions Neurological: as above Psychiatric: No depression at this time.  No anxiety Endocrine: No palpitations, diaphoresis, change in appetite, change in weigh or increased thirst Hematologic/Lymphatic: No anemia, purpura, petechiae. Allergic/Immunologic: No itchy/runny eyes, nasal congestion, recent allergic reactions, rashes  ALLERGIES: No Known Allergies  HOME MEDICATIONS:  Current Outpatient Medications:    butalbital-acetaminophen-caffeine (ESGIC) 50-325-40 MG tablet, Take one or two prn migraine, Disp: 30 tablet, Rfl: 3   ibuprofen (ADVIL,MOTRIN) 200 MG tablet, Take 200 mg by mouth every 6 (six) hours as needed., Disp: , Rfl:    levonorgestrel-ethinyl estradiol (SEASONALE,INTROVALE,JOLESSA) 0.15-0.03 MG  tablet, Take 1 tablet by mouth daily., Disp: , Rfl: 0   ocrelizumab (OCREVUS) 300 MG/10ML injection, Inject 600 mg into the vein every 6 (six) months. , Disp: , Rfl:   PAST MEDICAL HISTORY: Past Medical History:  Diagnosis Date   Bradycardia    Hypoglycemia    Hypoglycemia    Migraine     PAST SURGICAL HISTORY: Past Surgical History:  Procedure Laterality Date   WISDOM TOOTH EXTRACTION      FAMILY HISTORY: Family  History  Problem Relation Age of Onset   Neuropathy Mother    Kidney cancer Father    Diabetes Maternal Grandfather    Heart block Paternal Grandfather        Required Pacemaker placement in his 87's    SOCIAL HISTORY:  Social History   Socioeconomic History   Marital status: Single    Spouse name: Not on file   Number of children: 0   Years of education: Not on file   Highest education level: Not on file  Occupational History   Not on file  Social Needs   Financial resource strain: Not on file   Food insecurity:    Worry: Not on file    Inability: Not on file   Transportation needs:    Medical: Not on file    Non-medical: Not on file  Tobacco Use   Smoking status: Never Smoker   Smokeless tobacco: Never Used  Substance and Sexual Activity   Alcohol use: Yes    Alcohol/week: 1.0 standard drinks    Types: 1 Glasses of wine per week   Drug use: No   Sexual activity: Yes    Birth control/protection: Pill  Lifestyle   Physical activity:    Days per week: Not on file    Minutes per session: Not on file   Stress: Not on file  Relationships   Social connections:    Talks on phone: Not on file    Gets together: Not on file    Attends religious service: Not on file    Active member of club or organization: Not on file    Attends meetings of clubs or organizations: Not on file    Relationship status: Not on file   Intimate partner violence:    Fear of current or ex partner: Not on file    Emotionally abused: Not on  file    Physically abused: Not on file    Forced sexual activity: Not on file  Other Topics Concern   Not on file  Social History Narrative   [employer] - class scheduler     PHYSICAL EXAM  There were no vitals filed for this visit.  There is no height or weight on file to calculate BMI.   General: The patient is well-developed and well-nourished and in no acute distress   Neurologic Exam  Mental status: The patient is alert and oriented x 3 at the time of the examination. The patient has apparent normal recent and remote memory, with an apparently normal attention span and concentration ability.   Speech is normal.  Cranial nerves: Extraocular movements are full.  Facial strength and sensation is normal.  Trapezius strength is normal.  The tongue is midline, and the patient has symmetric elevation of the soft palate. No obvious hearing deficits are noted.  Motor:  Muscle bulk is normal.   Tone is normal. Strength is  5 / 5 in all 4 extremities.   Sensory: Sensory testing is intact to pinprick, soft touch and vibration sensation in all 4 extremities.  Coordination: Finger-nose-finger and heel-to-shin is performed well today.  Gait and station: Station is normal.   The gait and the tandem gait are normal.  Romberg sign is negative. Reflexes: Deep tendon reflexes are symmetric and normal bilaterally.      DIAGNOSTIC DATA (LABS, IMAGING, TESTING) - I reviewed patient records, labs, notes, testing and imaging myself where available.  Lab Results  Component Value Date   WBC 19.2 (H) 10/11/2018  HGB 12.4 10/11/2018   HCT 36.9 10/11/2018   MCV 84 10/11/2018   PLT 316 10/11/2018      Component Value Date/Time   NA 140 07/18/2017 0143   NA 147 (H) 07/09/2017 1150   K 4.1 07/18/2017 0143   CL 108 07/18/2017 0143   CO2 24 07/18/2017 0143   GLUCOSE 118 (H) 07/18/2017 0143   BUN 14 07/18/2017 0143   BUN 10 07/09/2017 1150   CREATININE 0.87 07/18/2017 0143   CREATININE 0.74  02/06/2013 1559   CALCIUM 9.1 07/18/2017 0143   PROT 7.2 07/15/2017 2028   PROT 7.0 07/09/2017 1150   ALBUMIN 4.2 07/15/2017 2028   ALBUMIN 4.4 07/09/2017 1150   AST 25 07/15/2017 2028   ALT 25 07/15/2017 2028   ALKPHOS 53 07/15/2017 2028   BILITOT 0.4 07/15/2017 2028   BILITOT 0.3 07/09/2017 1150   GFRNONAA >60 07/18/2017 0143   GFRAA >60 07/18/2017 0143   No results found for: CHOL, HDL, LDLCALC, LDLDIRECT, TRIG, CHOLHDL No results found for: HGBA1C Lab Results  Component Value Date   VITAMINB12 279 07/26/2017   Lab Results  Component Value Date   TSH 2.218 07/16/2017       ASSESSMENT AND PLAN  Multiple sclerosis ([place])  Atypical migraine  Facial numbness  Insomnia, unspecified type   Nicolai Labonte A. Felecia Shelling, MD, PhD, FAAN Certified in Neurology, Clinical Neurophysiology, Sleep Medicine, Pain Medicine and Neuroimaging Director, [place] [facility] [address] [place], [place] [address] [phone]

## 2019-04-16 ENCOUNTER — Telehealth: Payer: Self-pay | Admitting: Neurology

## 2019-04-16 NOTE — Telephone Encounter (Signed)
I called to complete MRI request for the patient. Due to the fact that she has had an MRI in the last year and has not had any worsening symptoms the request is currently denied. If you would like to perform a P2P the number is [phone]. It will be open until 04/22. DW

## 2019-04-18 NOTE — Telephone Encounter (Signed)
I'll let this lapse...   We will re check an MRi later in the year after a year has gone by

## 2019-04-19 NOTE — Telephone Encounter (Signed)
Noted  

## 2019-04-23 NOTE — Telephone Encounter (Signed)
Actually her insurance has approved the MRI's . [employer] Auth: [id number] (exp. 04/16/19 to 10/12/19)   I left a voicemail again on patient phone informing her to disregard my first message but her [employer] insurance has approved the MRI's and that I am sending the orders to GI. If she has not heard from them in the next few days to give them a call at [phone].

## 2019-04-23 NOTE — Telephone Encounter (Signed)
I left a voicemail on patient phone informing her that her MRI was denied due to the face that she has had a MRI in the last year and it has not yet been a year. And Per Dr. Epimenio Foot he will re check the MRI later in the year after the year has gone by.

## 2019-05-08 ENCOUNTER — Ambulatory Visit
Admission: RE | Admit: 2019-05-08 | Discharge: 2019-05-08 | Disposition: A | Discharge status: Home / Self Care | Payer: BC Managed Care – PPO | Source: Ambulatory Visit | Attending: Neurology | Admitting: Neurology

## 2019-05-08 ENCOUNTER — Other Ambulatory Visit: Payer: Self-pay

## 2019-05-08 DIAGNOSIS — G35 Multiple sclerosis: Secondary | ICD-10-CM | POA: Diagnosis not present

## 2019-05-09 ENCOUNTER — Telehealth: Payer: Self-pay | Admitting: *Deleted

## 2019-05-09 NOTE — Telephone Encounter (Signed)
-----   Message from Asa Lente, MD sent at 05/08/2019  5:17 PM EDT ----- Please let her know that the MRIs look good.  There are no new MS lesions in the brain or spinal cord.  Disc protrusion in the cervical spine looks the same as her previous MRI.

## 2019-08-29 ENCOUNTER — Other Ambulatory Visit: Payer: Self-pay | Admitting: *Deleted

## 2019-08-29 DIAGNOSIS — G35 Multiple sclerosis: Secondary | ICD-10-CM

## 2019-08-29 MED ORDER — DOXEPIN HCL 10 MG/ML PO CONC: 10.0000 mg | Freq: Every day | ORAL | 1 refills | Status: AC

## 2020-05-27 DEATH — deceased

## 2020-12-09 ENCOUNTER — Telehealth: Payer: Self-pay | Admitting: Neurology

## 2020-12-09 NOTE — Telephone Encounter (Signed)
Pt's mother called wanting to inform Provider that the pt has passed away. Pt was 6 months pregnant and baby has also passed away. Mother Arline Asp wanted to inform provider that the medical examiner stated that the Ocrevus may have played a role in it and she would like the provider to know this so he can be aware when he prescribes this for future pts. Pt was well and active the day before and hours later died in her sleep.

## 2020-12-09 NOTE — Telephone Encounter (Signed)
Do we have sympathy cards?

## 2020-12-10 NOTE — Telephone Encounter (Signed)
Mailed sympathy card
